# Patient Record
Sex: Male | Born: 1975 | Hispanic: No | Marital: Single | Smoking: Former smoker
Health system: Southern US, Community
[De-identification: ages and names within clinical notes are randomized; demographics above are authoritative.]

## PROBLEM LIST (undated history)

## (undated) DIAGNOSIS — J45909 Unspecified asthma, uncomplicated: Secondary | ICD-10-CM

## (undated) DIAGNOSIS — J302 Other seasonal allergic rhinitis: Secondary | ICD-10-CM

## (undated) DIAGNOSIS — E119 Type 2 diabetes mellitus without complications: Secondary | ICD-10-CM

## (undated) DIAGNOSIS — K603 Anal fistula, unspecified: Secondary | ICD-10-CM

## (undated) DIAGNOSIS — K611 Rectal abscess: Secondary | ICD-10-CM

## (undated) DIAGNOSIS — I10 Essential (primary) hypertension: Secondary | ICD-10-CM

## (undated) HISTORY — PX: INGUINAL HERNIA REPAIR: SUR1180

## (undated) HISTORY — PX: HERNIA REPAIR: SHX51

## (undated) HISTORY — DX: Unspecified asthma, uncomplicated: J45.909

---

## 2001-12-25 ENCOUNTER — Emergency Department (HOSPITAL_COMMUNITY): Admission: EM | Admit: 2001-12-25 | Discharge: 2001-12-26 | Payer: Self-pay | Admitting: Emergency Medicine

## 2011-03-10 ENCOUNTER — Ambulatory Visit: Payer: Self-pay

## 2011-03-10 DIAGNOSIS — J301 Allergic rhinitis due to pollen: Secondary | ICD-10-CM

## 2011-03-10 DIAGNOSIS — J029 Acute pharyngitis, unspecified: Secondary | ICD-10-CM

## 2011-12-24 ENCOUNTER — Ambulatory Visit: Payer: Self-pay | Admitting: Family Medicine

## 2011-12-24 VITALS — BP 132/81 | HR 78 | Temp 98.0°F | Resp 16 | Ht 68.5 in | Wt 199.8 lb

## 2011-12-24 DIAGNOSIS — IMO0002 Reserved for concepts with insufficient information to code with codable children: Secondary | ICD-10-CM

## 2011-12-24 DIAGNOSIS — S39012A Strain of muscle, fascia and tendon of lower back, initial encounter: Secondary | ICD-10-CM

## 2011-12-24 DIAGNOSIS — M549 Dorsalgia, unspecified: Secondary | ICD-10-CM

## 2011-12-24 MED ORDER — MELOXICAM 15 MG PO TABS: 15.0000 mg | ORAL_TABLET | Freq: Every day | ORAL | Status: DC

## 2011-12-24 MED ORDER — CYCLOBENZAPRINE HCL 10 MG PO TABS: 10.0000 mg | ORAL_TABLET | Freq: Every evening | ORAL | Status: DC | PRN

## 2011-12-24 NOTE — Progress Notes (Signed)
[facility] [address], [place] [address] [phone]  Date:  12/24/2011   Name:  Travis Ryan   DOB:  [birth year]   MRN:  [id number]  PCP:  No primary provider on file.    Chief Complaint: Back Pain   History of Present Illness:  Travis Ryan is a 36 y.o. very pleasant male patient who presents with the following:  Last night when he got home from work he had bilateral lower back pain.  Now just the left side of his back hurts.   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  He has not tried any medications for this yet.  No weakness or numbness in his legs.   No bowel or bladder dysfunction.    He has never had this before.  No acute injury- insidious onset of pain.  He had a hard time sleeping last night.    There is no problem list on file for this patient.   No past medical history on file.  No past surgical history on file.  History  Substance Use Topics  . Smoking status: Former Smoker    Start date: 12/24/1999  . Smokeless tobacco: Not on file  . Alcohol Use: Not on file    No family history on file.  No Known Allergies  Medication list has been reviewed and updated.  No current outpatient prescriptions on file prior to visit.    Review of Systems:  As per HPI- otherwise negative. He is generally healthy as far as he knows   Physical Examination: Filed Vitals:   12/24/11 1500  BP: 132/81  Pulse: 78  Temp: 98 F (36.7 C)  Resp: 16   Filed Vitals:   12/24/11 1500  Height: 5' 8.5" (1.74 m)  Weight: 199 lb 12.8 oz (90.629 kg)   Body mass index is 29.94 kg/(m^2). Ideal Body Weight: Weight in (lb) to have BMI = 25: 166.5   GEN: WDWN, NAD, Non-toxic, A & O x 3 HEENT: Atraumatic, Normocephalic. Neck supple. No masses, No LAD. Ears and Nose: No external deformity. CV: RRR, No M/G/R. No JVD. No thrill. No extra heart sounds. PULM: CTA B, no wheezes, crackles, rhonchi. No retractions. No resp.  distress. No accessory muscle use. ABD: S, NT, ND. EXTR: No c/c/e NEURO Normal gait.   Negative SLR bilaterally, normal DTR bilateral legs.  Both legs with normal sensation and strength Back: mild tenderness left thoracic muscles.  No bony TTP.  Good flexion and extension PSYCH: Normally interactive. Conversant. Not depressed or anxious appearing.  Calm demeanor.    Assessment and Plan: 1. Back pain    2. Back strain  cyclobenzaprine (FLEXERIL) 10 MG tablet, meloxicam (MOBIC) 15 MG tablet   Let me know if not better in the next few days- Sooner if worse.     Abbe Amsterdam, MD

## 2012-01-16 ENCOUNTER — Ambulatory Visit: Payer: Self-pay | Admitting: Physician Assistant

## 2012-01-16 VITALS — BP 134/98 | HR 68 | Temp 97.4°F | Resp 16 | Ht 67.0 in | Wt 196.0 lb

## 2012-01-16 DIAGNOSIS — J029 Acute pharyngitis, unspecified: Secondary | ICD-10-CM

## 2012-01-16 LAB — POCT RAPID STREP A (OFFICE): Rapid Strep A Screen: NEGATIVE

## 2012-01-16 MED ORDER — MAGIC MOUTHWASH W/LIDOCAINE: 10.0000 mL | ORAL | Status: DC | PRN

## 2012-01-16 MED ORDER — AMOXICILLIN 875 MG PO TABS: 875.0000 mg | ORAL_TABLET | Freq: Two times a day (BID) | ORAL | Status: DC

## 2012-01-16 NOTE — Progress Notes (Signed)
Subjective:    Patient ID: Travis Ryan, male    DOB: 1976-01-06, 36 y.o.   MRN: [id number]  HPI 36 year old male presents with 2 day history of sore throat, chills, and subjective fever.  Denies cough, nasal congestion, nausea, vomiting, or abdominal pain. Admits that he got his flu shot last week and believes it has made him sick.  No known contacts of strep.  Otherwise healthy person.      Review of Systems  Constitutional: Positive for chills. Negative for fever.  HENT: Positive for congestion and sore throat. Negative for ear pain and neck pain.   Respiratory: Negative for cough, shortness of breath and wheezing.   Gastrointestinal: Negative for nausea, vomiting and abdominal pain.  All other systems reviewed and are negative.       Objective:   Physical Exam  Constitutional: He is oriented to person, place, and time. He appears well-developed and well-nourished.  HENT:  Head: Normocephalic and atraumatic.  Right Ear: Hearing, tympanic membrane, external ear and ear canal normal.  Left Ear: Hearing, tympanic membrane, external ear and ear canal normal.  Mouth/Throat: Uvula is midline, oropharynx is clear and moist and mucous membranes are normal. No oropharyngeal exudate (bilateral erythema, no tonsillar swelling).  Eyes: Conjunctivae normal are normal.  Neck: Normal range of motion. Neck supple.  Cardiovascular: Normal rate, regular rhythm and normal heart sounds.   Pulmonary/Chest: Effort normal and breath sounds normal.  Lymphadenopathy:    He has no cervical adenopathy.  Neurological: He is alert and oriented to person, place, and time.  Psychiatric: He has a normal mood and affect. His behavior is normal. Judgment and thought content normal.          Assessment & Plan:   1. Acute pharyngitis  POCT rapid strep A   Duke's mouthwash prn sore throat Ibuprofen or tylenol as needed Will cover with Amoxicillin 875 mg bid Follow up if symptoms worsen or fail to  improve

## 2013-06-28 ENCOUNTER — Ambulatory Visit: Payer: Self-pay | Admitting: Family Medicine

## 2013-06-28 VITALS — BP 130/94 | HR 72 | Temp 98.2°F | Resp 16 | Ht 69.0 in | Wt 208.0 lb

## 2013-06-28 DIAGNOSIS — R059 Cough, unspecified: Secondary | ICD-10-CM

## 2013-06-28 DIAGNOSIS — IMO0002 Reserved for concepts with insufficient information to code with codable children: Secondary | ICD-10-CM

## 2013-06-28 DIAGNOSIS — J029 Acute pharyngitis, unspecified: Secondary | ICD-10-CM

## 2013-06-28 DIAGNOSIS — S39012A Strain of muscle, fascia and tendon of lower back, initial encounter: Secondary | ICD-10-CM

## 2013-06-28 DIAGNOSIS — R05 Cough: Secondary | ICD-10-CM

## 2013-06-28 MED ORDER — HYDROCODONE-HOMATROPINE 5-1.5 MG/5ML PO SYRP: 5.0000 mL | ORAL_SOLUTION | Freq: Three times a day (TID) | ORAL | Status: DC | PRN

## 2013-06-28 MED ORDER — CYCLOBENZAPRINE HCL 10 MG PO TABS: 10.0000 mg | ORAL_TABLET | Freq: Every evening | ORAL | Status: DC | PRN

## 2013-06-28 MED ORDER — AMOXICILLIN 875 MG PO TABS: 875.0000 mg | ORAL_TABLET | Freq: Two times a day (BID) | ORAL | Status: DC

## 2013-06-28 NOTE — Progress Notes (Signed)
      Acute pharyngitis - Plan: amoxicillin (AMOXIL) 875 MG tablet  Cough - Plan: HYDROcodone-homatropine (HYCODAN) 5-1.5 MG/5ML syrup  Back strain - Plan: cyclobenzaprine (FLEXERIL) 10 MG tablet  Signed, Elvina SidleKurt Karanvir Balderston, MD

## 2013-09-12 ENCOUNTER — Ambulatory Visit (INDEPENDENT_AMBULATORY_CARE_PROVIDER_SITE_OTHER): Payer: Self-pay | Admitting: Family Medicine

## 2013-09-12 ENCOUNTER — Ambulatory Visit (INDEPENDENT_AMBULATORY_CARE_PROVIDER_SITE_OTHER): Payer: Self-pay

## 2013-09-12 VITALS — BP 122/88 | HR 82 | Temp 98.2°F | Resp 18 | Ht 68.75 in | Wt 206.6 lb

## 2013-09-12 DIAGNOSIS — D72829 Elevated white blood cell count, unspecified: Secondary | ICD-10-CM

## 2013-09-12 DIAGNOSIS — M25559 Pain in unspecified hip: Secondary | ICD-10-CM

## 2013-09-12 DIAGNOSIS — R109 Unspecified abdominal pain: Secondary | ICD-10-CM

## 2013-09-12 DIAGNOSIS — T148XXA Other injury of unspecified body region, initial encounter: Secondary | ICD-10-CM

## 2013-09-12 LAB — POCT CBC
Granulocyte percent: 71.6 %G (ref 37–80)
HCT, POC: 47.2 % (ref 43.5–53.7)
Hemoglobin: 15.5 g/dL (ref 14.1–18.1)
Lymph, poc: 2.6 (ref 0.6–3.4)
MCH, POC: 29.5 pg (ref 27–31.2)
MCHC: 32.8 g/dL (ref 31.8–35.4)
MCV: 90 fL (ref 80–97)
MID (cbc): 0.7 (ref 0–0.9)
MPV: 10.2 fL (ref 0–99.8)
POC Granulocyte: 8.2 — AB (ref 2–6.9)
POC LYMPH PERCENT: 22.5 %L (ref 10–50)
POC MID %: 5.9 %M (ref 0–12)
Platelet Count, POC: 231 10*3/uL (ref 142–424)
RBC: 5.25 M/uL (ref 4.69–6.13)
RDW, POC: 13.4 %
WBC: 11.4 10*3/uL — AB (ref 4.6–10.2)

## 2013-09-12 LAB — POCT UA - MICROSCOPIC ONLY
Bacteria, U Microscopic: NEGATIVE
Casts, Ur, LPF, POC: NEGATIVE
Crystals, Ur, HPF, POC: NEGATIVE
Mucus, UA: NEGATIVE
RBC, urine, microscopic: NEGATIVE
WBC, Ur, HPF, POC: NEGATIVE
Yeast, UA: NEGATIVE

## 2013-09-12 LAB — POCT URINALYSIS DIPSTICK
Bilirubin, UA: NEGATIVE
Blood, UA: NEGATIVE
Glucose, UA: NEGATIVE
Ketones, UA: NEGATIVE
Leukocytes, UA: NEGATIVE
Nitrite, UA: NEGATIVE
Spec Grav, UA: 1.025
Urobilinogen, UA: 0.2
pH, UA: 5.5

## 2013-09-12 MED ORDER — AZITHROMYCIN 250 MG PO TABS: ORAL_TABLET | ORAL | Status: DC

## 2013-09-12 MED ORDER — MELOXICAM 15 MG PO TABS: 15.0000 mg | ORAL_TABLET | Freq: Every day | ORAL | Status: DC

## 2013-09-12 MED ORDER — CYCLOBENZAPRINE HCL 10 MG PO TABS: 10.0000 mg | ORAL_TABLET | Freq: Every evening | ORAL | Status: DC | PRN

## 2013-09-12 NOTE — Patient Instructions (Signed)
RESOURCE GUIDE ° °Chronic Pain Problems: °Contact [facility]  [phone] °Patients need to be referred by their primary care doctor. ° °Insufficient Money for Medicine: °Contact United Way:  call [phone]) [phone] ° °No Primary Care Doctor: °- Call [facility]  [phone] - can help you locate a primary care doctor that  accepts your insurance, provides certain services, etc. °- Physician Referral Service- 1-800-533-3463 ° °Agencies that provide inexpensive medical care: °- [facility]  [phone] °- [facility]  [phone] °- [facility]  [phone] °- [facility]  [phone] °- [facility]  [phone] °- [facility]  [phone] ° °Medicaid-accepting [place] Providers: °- [facility]- [address] ° [phone], Mon-Fri 9am-7pm, Sat 9am-1pm °- [facility]- [address], [address] ° [phone] °- [facility]- [address], [address] ° [phone] °- [facility]- [address] [place] [address] ° [phone] °- [facility]- [address], [phone] ° Only accepts [place] Medicaid patients after they have their name  applied to their card ° °Self Pay (no insurance) in [place]: °- Sickle Cell Patients - [facility] ° [address], [phone] °- [facility]- [address] ° [phone] °      -      [facility]- [address] °      -     [facility]- see information above (Speak to Pam H if you do not have insurance) °      -  [facility] [place]- [address],  [phone] °      -  [facility]- [address] [place] [address], [phone] °      -  Dr Osei-Bonsu-  [address], [place], [phone] °      -  [facility] - [address], [phone] °      -  [facility]- [address] [place] [address], [phone], also  [address], [phone] °      -     [facility]- [address], [phone], 1st & 3rd Saturday °        every month, 10am-1pm ° -     [facility] °  [address], [place]. °  Phone:  [phone], Fax:  [phone]. Hours of Operation:  9 am - 6 pm, M-F. ° -     [facility] °  [address], [address], [place] °  Phone: [phone], Fax: [phone]. Hours of Operation:  8:30 am - 5:30 pm, M-F. ° °[facility] °[address] °, [place] [address] °(336) [phone] ° °[facility] °[address] °[place],  [address] °(336) [phone] ° °1) Find a Doctor and Pay Out of Pocket °Although you won't have to find out who is covered by your insurance plan, it is a good idea to ask around and get recommendations. You will then need to call the office and see if the doctor you have chosen will accept you as a new patient and what types of options they offer for patients who are self-pay. Some doctors offer discounts or will set up payment plans for their patients who do not have insurance, but   you will need to ask so you aren't surprised when you get to your appointment. ° °2) Contact Your Local Health Department °Not all health departments have doctors that can see patients for sick visits, but many do, so it is worth a call to see if yours does. If you don't know where your local health department is, you can check in your phone book. The CDC also has a tool to help you locate your state's health department, and many state websites also have listings of all of their local health departments. ° °3) Find a Walk-in Clinic °If your illness is not likely to be very severe or complicated, you may want to try a walk in clinic. These are popping up all over the country in pharmacies, drugstores, and shopping centers. They're usually staffed by nurse practitioners or physician assistants that have been trained  to treat common illnesses and complaints. They're usually fairly quick and inexpensive. However, if you have serious medical issues or chronic medical problems, these are probably not your best option ° °STD Testing °- [place] [facility] [place], [facility], [address], [place], phone [phone] or [phone].  Monday - Friday, call for an appointment. °- [place] [facility] [place], [facility], [address], [place], phone [phone] or [phone].  Monday - Friday, call for an appointment. ° °Abuse/Neglect: °- [place] Child Abuse Hotline (336) 641-3795 °- [place] Child Abuse Hotline 800-378-5315 (After Hours) ° °Emergency Shelter:  [facility] (336) 271-5985 ° °Maternity Homes: °- [facility] (336) 275-9566 °- [facility] (704) 372-4663 ° °MRSA Hotline #:   832-7006 ° °Dental Assistance °If unable to pay or uninsured, contact:  [place] [facility] to become qualified for the adult dental clinic. ° °Patients with Medicaid: [facility] °[address], [phone] °[address], [phone] ° °If unable to pay, or uninsured, contact [place] [facility] ([phone] in [place], [phone] in [place]) to become qualified for the adult dental clinic ° °[facility] °[address] °[place], [place] [address] °(336) 272-4177 °www.[facility].com ° °Other Low-Cost Community Dental Services: °- [facility]- [address], [place], [place], [address], [phone], Ext. 123, 2nd and 4th Thursday of the month at 6:30am.  10 clients each day by appointment, can sometimes see walk-in patients if someone does not show for an appointment. °- [facility]- [address], [place], [place], [address], [phone] °- [facility]- [address], [place], [place], [address], [phone] °- [place]  [facility]- 342-8273 °- [facility]- [phone] °- [facility]- 570-6415 ° °     Behavioral Health Resources in the Community ° °Intensive Outpatient Programs: °[facility]      °[address] °[place], [place] °336-878-6098 °Both a day and evening program °      °[facility]     °[address]        °[place], [place] [address] °336-832-9800        ° °[facility] °[address] °[place] °336-882-2125 ° °[facility] °ACCESS LINE: 1-800-853-5163 or 336-641-4981 °[address] °[place], [place] [address] °Http://www.[email or web address].[email or web address]/adult.htm ° ° °Substance Abuse Resources: °- [facility]  336-882-2125 °- [facility] 336-784-9470 °- [facility] 336-285-9073 °- [facility] 336-845-3988 °-   Residential & Outpatient Substance Abuse Program  800-659-3381 ° °Psychological Services: °- [facility]  832-9600 °- [facility]  378-7881 °- [facility], [address], [place], ACCESS LINE: 1-800-853-5163 or 336-641-4981, Http://www.[facility].com/services/adult.htm ° °Mobile Crisis Teams:         °                               °[facility]         °Mobile Crisis Care Unit °1-877-626-1772       °      °Assertive °[facility] °[address] [place] °336-834-9664 °                                        °[facility] °Sharon DeEsch °[address] °[place] °336-554-5454 ° °Self-Help/Support Groups: °[facility] Variety of support groups °373-1402 (call for more info) ° °Narcotics Anonymous (NA) °[facility] °[address] °[place] - 2 meetings at this location ° °Residential Treatment Programs:  °[facility]      °[address]        °[place]       °866-801-8205        ° °[facility] °[address] °[place], [place]  [address] °704-293-8524 ° °[facility]  °[address] °[place], [place] [address] °336-845-3988 °Admissions: 8am-3pm M-F ° °[facility]     °[address]        °[place], [place] [address]       °336-841-1104        ° °[facility] °[address] °[place], [place] °336-379-7146 ° °[facility] °[address] °[place], [place] °336-285-9073 ° °[facility] - Intensive Outpatient      °[address]     °[place], [place]       °852-3033        ° °[facility]     °[address] °[place], [place] °877-615-2722 or 336-784-9470 ° °[facility] ([facility]), Medicaid °[address] °[place], [place] °336-227-7417 ° °[facility]                                               °[address] °[place] °800-659-3381 ° °[place] [facility]: °[facility]- 1-888-581-9988              ° °General Therapy                                                °Julie Brannon, PhD        °[address]                                       °[place],  [address]         °336-349-5553   °Insurance ° °[facility]   °[address] °[place], [place] [address] °336-349-4454 ° °[facility] °[address] [place], [place] [address] °336-342-8316 °Insurance/Medicaid/sponsorship through [facility] ° °[facility]                                              °[address]                                        °[place], [place] [address]    °Therapy/tele-psych/case         °336-342-8316        °  °[facility] °[address]  ° [place], [place]  [address]  °Adolescent/group home/case management °336-349-2233  °                                         °Julia Brannon PhD       °General therapy       °Insurance   °336-951-0000        ° °Dr. Arfeen, Insurance, M-F °336- 349-4544 ° °[facility] [place]  [facility]  [employer] [place] [facility] °[address]                 [address]         [address]  °[place]                                               [place]                              [place] °Phone:  [phone]                                  Phone:  [phone]                   Phone:  342-8140 ° °[facility], 342-8316 °- [place] [facility] - [facility]- 1-888-581-9988 °      -     [facility] in [place], [address], °            336-349-4454, Insurance ° °[place] Child Abuse Hotline °(336) 342-1394 or (336) 342-3537 (After Hours) ° ° °

## 2013-09-12 NOTE — Progress Notes (Signed)
Chief Complaint:  Chief Complaint  Patient presents with  . Abdominal Pain    x 2 days, lower left and right abdominal pain, lower back pain, movement makes it worse    HPI: Travis Ryan is a 38 y.o. male who is here for  2 day history of bilateral lower pelvic pain 2 days ago, this occurred after  [place] and started having significant pain in bilateral legs near his groin and pelvic area, He has sharp pain, better today but still has a lot of pain when he moves, He has not tried anythign for this. HE Denies any fevers or chills, any nv, numbness or tingling, problems with incontinence, urination issues, or tick bites..He states he has had low back pain in the  Past where he ahd to be in rehab for 6 months due to a lifting injury but he ahs not had back pain since his rehab was done. Again he denies any urianry sxs or bloody urine, he denies STDs.  He denies any h.o kidney stones, UTIs, constipation; has been passing gas and also had normal BM    History reviewed. No pertinent past medical history. History reviewed. No pertinent past surgical history. History   Social History  . Marital Status: Single    Spouse Name: N/A    Number of Children: N/A  . Years of Education: N/A   Social History Main Topics  . Smoking status: Former Smoker    Start date: 12/24/1999  . Smokeless tobacco: None  . Alcohol Use: 7.2 oz/week    12 Cans of beer per week     Comment: socal  . Drug Use: No  . Sexual Activity: None   Other Topics Concern  . None   Social History Narrative  . None   Family History  Problem Relation Age of Onset  . Cancer Maternal Grandmother   . Early death Maternal Grandfather   . Cancer Paternal Grandmother   . Early death Paternal Grandfather    No Known Allergies Prior to Admission medications   Medication Sig Start Date End Date Taking? Authorizing Provider  Alum & Mag Hydroxide-Simeth (MAGIC  MOUTHWASH W/LIDOCAINE) SOLN Take 10 mLs by mouth every 2 (two) hours as needed. Use 1:1 ratio with viscous lidocaine and [facility]'s. 01/16/12   Heather Jaquita RectorM Marte, PA-C  amoxicillin (AMOXIL) 875 MG tablet Take 1 tablet (875 mg total) by mouth 2 (two) times daily. 06/28/13   Elvina SidleKurt Lauenstein, MD  cyclobenzaprine (FLEXERIL) 10 MG tablet Take 1 tablet (10 mg total) by mouth at bedtime as needed for muscle spasms. 06/28/13   Elvina SidleKurt Lauenstein, MD  HYDROcodone-homatropine [facility](HYCODAN) 5-1.5 MG/5ML syrup Take 5 mLs by mouth every 8 (eight) hours as needed for cough. 06/28/13   Elvina SidleKurt Lauenstein, MD  meloxicam (MOBIC) 15 MG tablet Take 1 tablet (15 mg total) by mouth daily. 12/24/11   Gwenlyn FoundJessica C Copland, MD     ROS: The patient denies fevers, chills, night sweats, unintentional weight loss, chest pain, palpitations, wheezing, dyspnea on exertion, nausea, vomiting, abdominal pain, dysuria, hematuria, melena, numbness, weakness, or tingling or incontinence  All other systems have been reviewed and were otherwise negative with the exception of those mentioned in the HPI and as above.    PHYSICAL EXAM: Filed Vitals:   09/12/13 1459  BP: 122/88  Pulse: 82  Temp: 98.2 F (36.8 C)  Resp: 18   Filed Vitals:   09/12/13 1459  Height: 5' 8.75" (1.746 m)  Weight: 206 lb 9.6 oz (93.713 kg)   Body mass index is 30.74 kg/(m^2).  General: Alert, no acute distress HEENT:  Normocephalic, atraumatic, oropharynx patent. EOMI, PERRLA Cardiovascular:  Regular rate and rhythm, no rubs murmurs or gallops.  No Carotid bruits, radial pulse intact. No pedal edema.  Respiratory: Clear to auscultation bilaterally.  No wheezes, rales, or rhonchi.  No cyanosis, no use of accessory musculature GI: No organomegaly, abdomen is soft and non-tender, positive bowel sounds.  No masses. Skin: No rashes. Neurologic: Facial musculature symmetric. Psychiatric: Patient is appropriate throughout our interaction. Lymphatic: No cervical  lymphadenopathy Musculoskeletal: Gait intact. NEg for abd or inguinal hernia + paramsk tenderness along the pubic symphysis and also the lower pelvic area He has Full ROM of his hips and legs but he is in pain at the pelvic area when he moves his legs only 5/5 strength, 2/2 DTRs No saddle anesthesia Straight leg negative Hip and knee exam--normal Back exam normal-full ROM without pain    LABS: Results for orders placed in visit on 09/12/13  POCT CBC      Result Value Ref Range   WBC 11.4 (*) 4.6 - 10.2 K/uL   Lymph, poc 2.6  0.6 - 3.4   POC LYMPH PERCENT 22.5  10 - 50 %L   MID (cbc) 0.7  0 - 0.9   POC MID % 5.9  0 - 12 %M   POC Granulocyte 8.2 (*) 2 - 6.9   Granulocyte percent 71.6  37 - 80 %G   RBC 5.25  4.69 - 6.13 M/uL   Hemoglobin 15.5  14.1 - 18.1 g/dL   HCT, POC 45.4  09.8 - 53.7 %   MCV 90.0  80 - 97 fL   MCH, POC 29.5  27 - 31.2 pg   MCHC 32.8  31.8 - 35.4 g/dL   RDW, POC 11.9     Platelet Count, POC 231  142 - 424 K/uL   MPV 10.2  0 - 99.8 fL  POCT UA - MICROSCOPIC ONLY      Result Value Ref Range   WBC, Ur, HPF, POC neg     RBC, urine, microscopic neg     Bacteria, U Microscopic neg     Mucus, UA neg     Epithelial cells, urine per micros 0-1     Crystals, Ur, HPF, POC neg     Casts, Ur, LPF, POC neg     Yeast, UA neg    POCT URINALYSIS DIPSTICK      Result Value Ref Range   Color, UA dark yellow     Clarity, UA clear     Glucose, UA neg     Bilirubin, UA neg     Ketones, UA neg     Spec Grav, UA 1.025     Blood, UA neg     pH, UA 5.5     Protein, UA trace     Urobilinogen, UA 0.2     Nitrite, UA neg     Leukocytes, UA Negative       EKG/XRAY:   Primary read interpreted by [facility] at [facility]. Neg for fracture or dislocation Normal abd xray   ASSESSMENT/PLAN: Encounter Diagnoses  Name Primary?  . Abdominal pain, unspecified site Yes  . Pain in joint, pelvic region and thigh, unspecified laterality    Very pleasant uninsured hispanic male  who is here for pelvic pain, KUB and pelvic xray normal,  urine is normal He has some mils leukocytosis which I will presumptively treat with Azithroymycin 1 gram x 1 for Chlamydia coverage He declines TSD testing due to expense, he was given resources to go get tested for other STDs The slight white count may be  due to msk strain and sprain. Rx mobic and flexeril IF no resolution then follow-up.   Gross sideeffects, risk and benefits, and alternatives of medications d/w patient. Patient is aware that all medications have potential sideeffects and we are unable to predict every sideeffect or drug-drug interaction that may occur.  Hamilton CapriLE, Falecia Vannatter PHUONG, DO 09/12/2013 4:45 PM

## 2014-02-09 ENCOUNTER — Ambulatory Visit (INDEPENDENT_AMBULATORY_CARE_PROVIDER_SITE_OTHER): Payer: Self-pay | Admitting: Sports Medicine

## 2014-02-09 VITALS — BP 126/84 | HR 83 | Temp 97.9°F | Resp 18 | Ht 69.0 in | Wt 207.0 lb

## 2014-02-09 DIAGNOSIS — M7042 Prepatellar bursitis, left knee: Secondary | ICD-10-CM | POA: Insufficient documentation

## 2014-02-09 MED ORDER — MELOXICAM 15 MG PO TABS: 15.0000 mg | ORAL_TABLET | Freq: Every day | ORAL | Status: DC

## 2014-02-09 NOTE — Progress Notes (Signed)
Travis Ryan - 38 y.o. male MRN [id number]  Date of birth: April 05, 1975  CC & HPI:  Patient presents with acute complaint of: Left anterior knee swelling: Patient reports proximally 6 hours of left anterior knee swelling. He denies any pain with this. No fevers, chills, redness or erythema. 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 Has never had anything like this before. Reports some difficulty with extension but uncomfortable due to tightness more so than painful.  ROS:  Per HPI.   HISTORY: Past Medical, Surgical, Social, and Family History Reviewed & Updated per EMR.  Pertinent Historical Findings include: Otherwise healthy   OBJECTIVE:  VS:   HT:5\' 9"  (175.3 cm)   WT:207 lb (93.895 kg)  BMI:30.6          BP:126/84 mmHg  HR:83bpm  TEMP:97.9 F (36.6 C)(Oral)  RESP:95 %  PHYSICAL EXAM: GENERAL:  adult Hispanic male. In no discomfort; no respiratory distress Patient declines interpreter services and speaks good [place]   PSYCH: alert and appropriate, good insight   NEURO: sensation is intact to light touch in BLE   VASCULAR:  DP pulses 2+/4.  No significant edema.    Left knee EXAM: Appearance:  marked anterior swelling over the patella. Fluctuant and palpable bursa No significant intra-articular effusion.   Skin: No overlying erythema/ecchymosis. no streaking erythema   Palpation: No TTP over: Prepatellar bursa, medial or lateral joint lines, patellar or quadriceps tendon   Strength, ROM & OtherTests:  flexor and extensor mechanisms intact Stable anterior posterior drawer, stable to varus and valgus strain. Negative McMurray's    ASSESSMENT: 1. Prepatellar bursitis of left knee    Nonseptic prepatellar traumatic bursitis  PLAN: See problem based charting & AVS for additional documentation.  Meloxicam 2 weeks  Compression  Ice  Reviewed red flags for worsening symptoms that would warrant aspiration/ I&D. This was offered  today but patient declined > Return if symptoms worsen or fail to improve.

## 2014-02-09 NOTE — Patient Instructions (Signed)
Keep your knee wrapped with the ace wrap Use ice for 15 minutes 3-5Xs daily Prepatellar Bursitis with Rehab  Bursitis is a condition that is characterized by inflammation of a bursa. Kateri McBursa exists in many areas of the body. They are fluid-filled sacs that lie between a soft tissue (skin, tendon, or ligament) and a bone, and they reduce friction between the structures as well as the stress placed on the soft tissue. Prepatellar bursitis is inflammation of the bursa that lies between the skin and the kneecap (patella). This condition often causes pain over the patella. SYMPTOMS   Pain, tenderness, and/or inflammation over the patella.  Pain that worsens with movement of the knee joint.  Decreased range of motion for the knee joint.  A crackling sound (crepitation) when the bursa is moved or touched.  Occasionally, painless swelling of the bursa.  Fever (when infected). CAUSES  Bursitis is caused by damage to the bursa, which results in an inflammatory response. Common mechanisms of injury include:  Direct trauma to the front of the knee.  Repetitive and/or stressful use of the knee. RISK INCREASES WITH:  Activities in which kneeling and/or falling on one's knees is likely (volleyball or football).  Repetitive and stressful training, especially if it involves running on hills.  Improper training techniques, such as a sudden increase in the intensity, frequency, or duration of training.  Failure to warm up properly before activity.  Poor technique.  Artificial turf. PREVENTION   Avoid kneeling or falling on your knees.  Warm up and stretch properly before activity.  Allow for adequate recovery between workouts.  Maintain physical fitness:  Strength, flexibility, and endurance.  Cardiovascular fitness.  Learn and use proper technique. When possible, have a coach correct improper technique.  Wear properly fitted and padded protective equipment (kneepads). PROGNOSIS  If  treated properly, then the symptoms of prepatellar bursitis usually resolve within 2 weeks. RELATED COMPLICATIONS   Recurrent symptoms that result in a chronic problem.  Prolonged healing time, if improperly treated or reinjured.  Limited range of motion.  Infection of bursa.  Chronic inflammation or scarring of bursa. TREATMENT  Treatment initially involves the use of ice and medication to help reduce pain and inflammation. The use of strengthening and stretching exercises may help reduce pain with activity, especially those of the quadriceps and hamstring muscles. These exercises may be performed at home or with referral to a therapist. Your caregiver may recommend kneepads when you return to playing sports, in order to reduce the stress on the prepatellar bursa. If symptoms persist despite treatment, then your caregiver may drain fluid out with a needle (aspirate) the bursa. If symptoms persist for greater than 6 months despite nonsurgical (conservative) treatment, then surgery may be recommended to remove the bursa.  MEDICATION  If pain medication is necessary, then nonsteroidal anti-inflammatory medications, such as aspirin and ibuprofen, or other minor pain relievers, such as acetaminophen, are often recommended.  Do not take pain medication for 7 days before surgery.  Prescription pain relievers may be given if deemed necessary by your caregiver. Use only as directed and only as much as you need.  Corticosteroid injections may be given by your caregiver. These injections should be reserved for the most serious cases, because they may only be given a certain number of times. HEAT AND COLD  Cold treatment (icing) relieves pain and reduces inflammation. Cold treatment should be applied for 10 to 15 minutes every 2 to 3 hours for inflammation and pain and immediately  after any activity that aggravates your symptoms. Use ice packs or massage the area with a piece of ice (ice  massage).  Heat treatment may be used prior to performing the stretching and strengthening activities prescribed by your caregiver, physical therapist, or athletic trainer. Use a heat pack or soak the injury in warm water. SEEK MEDICAL CARE IF:  Treatment seems to offer no benefit, or the condition worsens.  Any medications produce adverse side effects.   Bursitis prerrotuliana con rehabilitacin (Prepatellar Bursitis with Rehab) La bursitis es una enfermedad caracterizada por la inflamacin de una bursa. Las bursas existen en diferentes partes del cuerpo. Son bolsas llenas de lquido que se McDonald's Corporationubican entre los tejidos blandos (piel, tendones o Proofreaderligamentos) y Engineer, structuralun hueso, y Interior and spatial designerdisminuyen la friccin entre las estructuras as Neurosurgeoncomo el estrs que se aplica sobre los tejidos blandos. La bursitis prerrotuliana es una inflamacin de la bursa que se encuentra entre la piel y la Renovortula. Esta afeccin causa dolor en la rtula. SNTOMAS  Dolor, hinchazn, o molestias debajo de la rtula.  Dolor que empeora al mover la articulacin de la rodilla.  Disminucin de la amplitud de movimientos de la articulacin de la rodilla.  Ruido de "crack" (crepitacin) al mover o tocar la bursa.  Otras veces, la bursa se hincha pero no hay dolor.  Puede haber fiebre (en casos de infeccin). CAUSAS La causa de la bursitis es una lesin en la bursa, que da como resultado una respuesta inflamatoria. Los mecanismos ms frecuentes de la lesin son:  Traumatismo directo la zona anterior de la rodilla.  Actividad repetida o estresante de la rodilla. LOS RIESGOS AUMENTAN CON:  Actividades en las que son habituales las cadas o flexiones de las rodilla (voley o ftbol).  Entrenamiento repetitivo y Higher education careers adviserestresante, especialmente si involucra el correr sobre superficies inclinadas.  Tcnicas de entrenamiento incorrectas, incluso cambios repentinos en la intensidad, frecuencia o duracin de la [place]ividad.  No hacer un  precalentamiento adecuado.  Mala tcnica.  Csped sinttico. PREVENCIN  Evite arrodillarse o caer sobre las rodillas.  Precalentamiento adecuado y elongacin antes de la [place]ividad.  Descanso y recuperacin entre actividades.  Mantener la forma fsica:  Earma ReadingFuerza, flexibilidad y resistencia muscular.  Capacidad cardiovascular.  Aprenda y USAAutilice las tcnicas adecuadas. Cuando sea posible, cuente con un entrenador que corrija las tcnicas incorrectas.  Use el equipo protector adecuado y que le ajuste bien (protectores para las rodillas). PRONSTICO Si se trata adecuadamente, generalmente es curable dentro de las 2 semanas. COMPLICACIONES RELACIONADAS  La recurrencia frecuente de los sntomas puede dar como resultado un problema crnico.  Si no se trata adecuadamente, la curacin demorar ms tiempo.  Amplitud de movimientos limitada.  Infeccin de la bursa.  Inflamacin crnica o cicatrizacin de la bursa. TRATAMIENTO El tratamiento inicial incluye el uso de medicamentos y la aplicacin de hielo para reducir Chief Technology Officerel dolor y la inflamacin. Los ejercicios de elongacin y fortalecimiento pueden ayudar a reducir Chief Technology Officerel dolor con la actividad, en especial los cudriceps y los isquiotibiales. Los ejercicios pueden Management consultantrealizarse en el hogar o con un terapeuta. El mdico podr indicarle el uso de rodilleras cuando retorne a la prctica de deportes, con el objeto de reducir el estrs en la bursa prerrotuliana. Si los sntomas persisten a pesar del tratamiento, el mdico podr drenar (aspirar) el lquido de la bursa con una aguja. Si los sntomas persisten por ms de 6 meses de tratamiento no quirrgico (conservador), se indicar la ciruga para extirpar la bursa.  MEDICAMENTOS   Si  necesita analgsicos, se recomiendan los antiinflamatorios no esteroides, como aspirina e ibuprofeno y otros calmantes menores, como acetaminofeno  No tome medicamentos para Chief Technology Officer dentro de los [address] previos a la  [place].  Los analgsicos prescriptos se indicarn si el mdico lo considera necesario. Utilcelos como se le indique y slo cuando lo necesite.  En algunos casos se indica una inyeccin de corticosteroides. Estas inyecciones deben reservarse para los [place] graves, porque slo se pueden administrar una determinada cantidad de veces. CALOR Y FRO   El tratamiento con fro [employer] y reduce la inflamacin. El fro debe aplicarse durante 10 a 15 minutos cada 2  3 horas para reducir la inflamacin y Chief Technology Officer e inmediatamente despus de cualquier actividad que agrava los sntomas. Utilice bolsas de hielo o masajee la zona con un trozo de hielo (masaje de hielo).  El calor puede usarse antes de Therapist, music y de las actividades de fortalecimiento indicadas por el profesional, le fisioterapeuta o Orthoptist. Utilice una bolsa trmica o sumerja la lesin en agua caliente. SOLICITE ATENCIN MDICA SI:  El tratamiento no Radiographer, therapeutic buenos resultados o el problema [place].  Algn medicamento le produce [employer].  ExitCare Patient Information 2015 [place], [place]. This information is not intended to replace advice given to you by your health care provider. Make sure you discuss any questions you have with your health care provider.

## 2014-03-02 ENCOUNTER — Ambulatory Visit (INDEPENDENT_AMBULATORY_CARE_PROVIDER_SITE_OTHER): Payer: Self-pay | Admitting: Family Medicine

## 2014-03-02 ENCOUNTER — Ambulatory Visit (HOSPITAL_COMMUNITY)
Admission: RE | Admit: 2014-03-02 | Discharge: 2014-03-02 | Disposition: A | Discharge status: Home / Self Care | Payer: Self-pay | Source: Ambulatory Visit | Attending: Pediatrics | Admitting: Pediatrics

## 2014-03-02 VITALS — BP 120/88 | HR 73 | Temp 98.6°F | Resp 16 | Ht 69.5 in | Wt 213.2 lb

## 2014-03-02 DIAGNOSIS — R202 Paresthesia of skin: Secondary | ICD-10-CM | POA: Insufficient documentation

## 2014-03-02 DIAGNOSIS — R609 Edema, unspecified: Secondary | ICD-10-CM

## 2014-03-02 DIAGNOSIS — M7989 Other specified soft tissue disorders: Secondary | ICD-10-CM | POA: Insufficient documentation

## 2014-03-02 DIAGNOSIS — M7042 Prepatellar bursitis, left knee: Secondary | ICD-10-CM

## 2014-03-02 DIAGNOSIS — M7122 Synovial cyst of popliteal space [Baker], left knee: Secondary | ICD-10-CM

## 2014-03-02 LAB — POCT CBC
GRANULOCYTE PERCENT: 59.9 % (ref 37–80)
HEMATOCRIT: 47.1 % (ref 43.5–53.7)
Hemoglobin: 16.1 g/dL (ref 14.1–18.1)
Lymph, poc: 2.8 (ref 0.6–3.4)
MCH: 29.7 pg (ref 27–31.2)
MCHC: 34.2 g/dL (ref 31.8–35.4)
MCV: 87.1 fL (ref 80–97)
MID (CBC): 0.6 (ref 0–0.9)
MPV: 8.6 fL (ref 0–99.8)
PLATELET COUNT, POC: 168 10*3/uL (ref 142–424)
POC Granulocyte: 5.41 (ref 2–6.9)
POC LYMPH %: 32.8 % (ref 10–50)
POC MID %: 7.3 %M (ref 0–12)
RBC: 5.41 M/uL (ref 4.69–6.13)
RDW, POC: 12.3 %
WBC: 8.6 10*3/uL (ref 4.6–10.2)

## 2014-03-02 LAB — GLUCOSE, POCT (MANUAL RESULT ENTRY): POC Glucose: 98 mg/dl (ref 70–99)

## 2014-03-02 LAB — POCT SEDIMENTATION RATE: POCT SED RATE: 2 mm/h (ref 0–22)

## 2014-03-02 NOTE — Progress Notes (Signed)
*  PRELIMINARY RESULTS* Vascular Ultrasound Left lower extremity venous duplex has been completed.  Preliminary findings: No evidence of DVT or baker's cyst.  Called results to Dr. Alver FisherShaw's office. Gave results to Maralyn SagoSarah.   Farrel DemarkJill Eunice, RDMS, RVT  03/02/2014, 3:58 PM

## 2014-03-02 NOTE — Patient Instructions (Addendum)
Please call the [facility] Financial Aide office at [phone] to ensure you get the 50% discount on all services for self-pay patients.  Please call the clinic below to get an appointment.  They are a clinic set up by [facility] to care for people without health insurance so may be able to get you care at much cheaper cost with a large discount on labs, imaging, and medication. [facility] [address] [place], [place] [address] Hours of Operation Mon - Fri: 9 a.m. - 6 p.m. Main: [phone]  Start an omega-3 supplement and glucosamine-chondroiton supplement.  Increase water to at least 8 glasses per day and eat plenty of protein.  Decrease salt - make sure to read labels.  Stop meloxicam for now.  Elevate your leg as much as possible.  Ice 15 minutes 3-4 times a day.  Quiste de Social research officer, government Cyst) El quiste de Engineer, production es una estructura similar a una bolsa, que se encuentra en la zona posterior de la rodilla. Est llena del mismo lquido contenido por la rodilla. El lquido [employer] y el cartlago de la rodilla y les permite desplazarse, uno sobre otro, ms fcilmente. CAUSAS  Cuando la rodilla se lesiona o inflama, se forma ms lquido en su interior. Cuando esto ocurre, el revestimiento de la articulacin es empujado hacia la parte posterior de la rodilla y forma el quiste de Engineer, production. Este quiste tambin puede ser provocado por inflamaciones derivadas de enfermedades artrticas e infecciones. SIGNOS Y SNTOMAS  El quiste de Engineer, production generalmente no presenta sntomas. Cuando el quiste se agranda considerablemente:  Puede sentir rigidez en la rodilla, presin o un bulto en la zona posterior.  Puede presentar dolor, enrojecimiento e hinchazn en la pantorrilla. Esto puede sugerir la formacin de un cogulo sanguneo y requiere Stage manager de parte de su mdico. DIAGNSTICO  Un quiste de Engineer, production se detecta, con mayor frecuencia, durante  una ecografa. Es posible que este examen se haya indicado por otras razones y que el quiste se detecte accidentalmente. En ocasiones, se utiliza Hotel manager. Este estudio detecta otros problemas en una articulacin, que no pueden observarse en una ecografa. Si el quiste de Engineer, production se form inmediatamente despus de una lesin, se pueden Chemical engineer radiografas para su diagnstico. TRATAMIENTO  El tratamiento depende de la causa del [place]. A menudo se indicarn antiinflamatorios y reposo. Si el quiste es provocado por una infeccin bacteriana, pueden recetarse antibiticos.  INSTRUCCIONES PARA EL CUIDADO EN EL HOGAR   Si el quiste fue causado por una lesin, mantenga elevada la pierna lesionada sobre 2 almohadas mientras est [place], durante las primeras 24 horas.  Aplique hielo a la zona lesionada mientras est despierto, durante las primeras 24 horas.  Ponga el hielo en una bolsa plstica.  Colquese una toalla entre la piel y la bolsa de hielo.  Deje el hielo durante 20 minutos, 2 a 3 veces por da.  Slo tome medicamentos de venta libre o recetados para Primary school teacher, Environmental health practitioner o bajar la fiebre, segn las indicaciones de su mdico.  [employer] antibiticos tal como se le indic. Finalice la prescripcin completa, aunque se sienta mejor. ASEGRESE DE QUE:   Comprende estas instrucciones.  Controlar su afeccin.  Recibir ayuda de inmediato si no mejora o si empeora. Document Released: 03/10/2005 Document Revised: 12/29/2012 [facility] Patient Information 2015 [place], [place]. This information is not intended to replace advice given to you by your health care provider.  Make sure you discuss any questions you have with your health care provider.   Bursitis prerrotuliana con rehabilitacin (Prepatellar Bursitis with Rehab) La bursitis es una enfermedad caracterizada por la inflamacin de una bursa. Las bursas existen en diferentes partes del cuerpo. Son bolsas llenas de  lquido que se McDonald's Corporationubican entre los tejidos blandos (piel, tendones o Proofreaderligamentos) y Engineer, structuralun hueso, y Interior and spatial designerdisminuyen la friccin entre las estructuras as Neurosurgeoncomo el estrs que se aplica sobre los tejidos blandos. La bursitis prerrotuliana es una inflamacin de la bursa que se encuentra entre la piel y la [place]ula. Esta afeccin causa dolor en la rtula. SNTOMAS  Dolor, hinchazn, o molestias debajo de la rtula.  Dolor que empeora al mover la articulacin de la rodilla.  Disminucin de la amplitud de movimientos de la articulacin de la rodilla.  Ruido de "crack" (crepitacin) al mover o tocar la bursa.  Otras veces, la bursa se hincha pero no hay dolor.  Puede haber fiebre (en casos de infeccin). CAUSAS La causa de la bursitis es una lesin en la bursa, que da como resultado una respuesta inflamatoria. Los mecanismos ms frecuentes de la lesin son:  Traumatismo directo la zona anterior de la rodilla.  Actividad repetida o estresante de la rodilla. LOS RIESGOS AUMENTAN CON:  Actividades en las que son habituales las cadas o flexiones de las rodilla (voley o ftbol).  Entrenamiento repetitivo y Higher education careers adviserestresante, especialmente si involucra el correr sobre superficies inclinadas.  Tcnicas de entrenamiento incorrectas, incluso cambios repentinos en la intensidad, frecuencia o duracin de la [place]actividad.  No hacer un precalentamiento adecuado.  Mala tcnica.  Csped sinttico. PREVENCIN  Evite arrodillarse o caer sobre las rodillas.  Precalentamiento adecuado y elongacin antes de la [place] Springsactividad.  Descanso y recuperacin entre actividades.  Mantener la forma fsica:  Earma ReadingFuerza, flexibilidad y resistencia muscular.  Capacidad cardiovascular.  Aprenda y USAAutilice las tcnicas adecuadas. Cuando sea posible, cuente con un entrenador que corrija las tcnicas incorrectas.  Use el equipo protector adecuado y que le ajuste bien (protectores para las rodillas). PRONSTICO Si se trata adecuadamente,  generalmente es curable dentro de las 2 semanas. COMPLICACIONES RELACIONADAS  La recurrencia frecuente de los sntomas puede dar como resultado un problema crnico.  Si no se trata adecuadamente, la curacin demorar ms tiempo.  Amplitud de movimientos limitada.  Infeccin de la bursa.  Inflamacin crnica o cicatrizacin de la bursa. TRATAMIENTO El tratamiento inicial incluye el uso de medicamentos y la aplicacin de hielo para reducir Chief Technology Officerel dolor y la inflamacin. Los ejercicios de elongacin y fortalecimiento pueden ayudar a reducir Chief Technology Officerel dolor con la actividad, en especial los cudriceps y los isquiotibiales. Los ejercicios pueden Management consultantrealizarse en el hogar o con un terapeuta. El mdico podr indicarle el uso de rodilleras cuando retorne a la prctica de deportes, con el objeto de reducir el estrs en la bursa prerrotuliana. Si los sntomas persisten a pesar del tratamiento, el mdico podr drenar (aspirar) el lquido de la bursa con una aguja. Si los sntomas persisten por ms de 6 meses de tratamiento no quirrgico (conservador), se indicar la ciruga para extirpar la bursa.  MEDICAMENTOS   Si necesita analgsicos, se recomiendan los antiinflamatorios no esteroides, como aspirina e ibuprofeno y otros calmantes menores, como acetaminofeno  No tome medicamentos para Chief Technology Officerel dolor dentro de los [address] das previos a la Azerbaijanciruga.  Los analgsicos prescriptos se indicarn si el mdico lo considera necesario. Utilcelos como se le indique y slo cuando lo necesite.  En algunos casos se indica una inyeccin  de corticosteroides. Estas inyecciones deben reservarse para los [place]asos graves, porque slo se pueden administrar una determinada cantidad de veces. CALOR Y FRO   El tratamiento con fro EchoStaralivia el dolor y reduce la inflamacin. El fro debe aplicarse durante 10 a 15 minutos cada 2  3 horas para reducir la inflamacin y Chief Technology Officerel dolor e inmediatamente despus de cualquier actividad que agrava los sntomas. Utilice  bolsas de hielo o masajee la zona con un trozo de hielo (masaje de hielo).  El calor puede usarse antes de Therapist, musicelongar y de las actividades de fortalecimiento indicadas por el profesional, le fisioterapeuta o Orthoptistel entrenador. Utilice una bolsa trmica o sumerja la lesin en agua caliente. SOLICITE ATENCIN MDICA SI:  El tratamiento no Radiographer, therapeuticle da buenos resultados o el problema Palmarejoempeora.  Algn medicamento le produce BB&T Corporationefectos adversos. EJERCICIOS EJERCICIOS DE AMPLITUD DE MOVIMIENTOS Y ELONGACIN - Bursitis prerrotuliana Estos ejercicios lo ayudarn al comienzo de la rehabilitacin. Los sntomas podrn aliviarse con o sin asistencia adicional de su mdico, fisioterapeuta o Herbalistentrenador. Al completar estos ejercicios, recuerde:   Restaurar la flexibilidad del tejido ayuda a que las articulaciones recuperen el movimiento normal. Esto permite que el movimiento y la actividad sea ms saludables y menos dolorosos.  Para que sea efectiva, cada elongacin debe realizarse durante al menos 30 segundos.  La elongacin nunca debe ser dolorosa. Deber sentir slo un alargamiento o distensin suave del tejido que estira. ELONGACIN - Isquiosurales de pie  Estando de pie o sentado, extienda la pierna derecha / izquierdo, colocando el pie en una silla o banco.  Doble ligeramente la espalda y las caderas, formando un arco suave hacia adelante.  Lleve el pecho hacia adelante hasta sentir un ligero estiramiento en la zona posterior de la rodilla o muslo derecha / izquierdo. (Si se realiza correctamente, este ejercicio requiere inclinarse muy poco hacia adelante).  Mantenga esta posicin durante __________ segundos. Reptalo __________ veces. Realice este estiramiento __________ Anthoney Haradaveces por da. FUERZA - Cudriceps - en posicin prona  Recustese sobre el [address] Stestmago sobre una superficie firme, como una cama o la mesada de la cocina.  Incline su rodilla derecha / izquierdo y tmese del tobillo. Si no puede alcanzar el  tobillo o la pierna del pantaln, use un cinturn alrededor del pie para aumentar su alcance.  Deslice lentamente el taln hacia las nalgas. La rodilla no debe deslizarse hacia el lado. Debe sentir un estiramiento en la parte anterior del muslo o de la rodilla.  Mantenga esta posicin durante __________ segundos. Reptalo __________ veces. Realice este estiramiento __________ Anthoney Haradaveces por da.  ELONGACIN - Isquiosurales y aductores - sentado en forma de V  Sintese en el piso con las piernas extendidas en forma de una gran "V" manteniendo las rodillas rectas.  Con la cabeza y el pecho erguidos, doble la cintura y trate de Adult nursellegar hasta el pie derecho para Therapist, musicelongar los aductores izquierdos.  Debe sentir un estiramiento en la zona interna del muslo izquierdo. Mantenga esta posicin durante __________ segundos.  Vuelva a la posicin erguida para relajar los msculos de las piernas.  Contine en esta posicin erguida, doble la cintura para elongar los tendones.  Debe sentir un estiramiento en la parte posterior de los muslos o de las rodillas. Mantenga esta posicin durante __________ segundos.  Vuelva a la posicin erguida para relajar los msculos de las piernas.  Repita los pasos 2 a 4. Reptalo __________ veces. Realice este ejercicio __________ veces por da.  EJERCICIOS DE FORTALECIMIENTO - Bursitis prerrotuliana  Estos  ejercicios lo ayudarn al comienzo de la rehabilitacin. Los sntomas podrn aliviarse con o sin asistencia adicional de su mdico, fisioterapeuta o Herbalist. Al completar estos ejercicios, recuerde:  Los msculos pueden ganar tanto la resistencia como la fuerza que necesita para sus actividades diarias a travs de ejercicios controlados.  Realice los ejercicios como se lo indic el mdico, el fisioterapeuta o Orthoptist. Aumente la resistencia y las repeticiones segn se le haya indicado. FUERZA - Cudriceps - isometricos  Recustese sobre la espalda con su pierna  derecha / izquierdo extendida y la rodilla opuesta doblada.  Tensione gradualmente los msculos de la zona anterior del muslo derecha / izquierdo. Ver que la rtula se desliza hacia arriba o que se intensifica el hoyuelo que se encuentra justo por arriba de la rodilla. Este movimiento empujar nuevamente la rodilla hacia abajo en direccin al piso, [place] o cama sobre la cual est recostado.  Sostenga de ese modo el msculo, tan apretado como pueda, sin que [employer], durante __________ segundos.  Relaje los msculos lenta y completamente entre cada repeticin. Reptalo __________ veces. Realice este ejercicio __________ veces por da.  FUERZA - Cudriceps - arcos cortos  Acustese sobre la espada. Coloque una toalla enrollada de __________ pulgadas debajo de su rodilla, de modo que se doble ligeramente.  Eleve slo la parte inferior de la pierna mediante la tensin de los msculos de la parte frontal del muslo . No permita que el muslo se eleve.  Mantenga esta posicin durante __________ segundos. Reptalo __________ veces. Realice este ejercicio __________ veces por da.  PESO OPCIONAL EN EL TOBILLO: Comience con ____________________, pero no se exceda de ____________________. Aumente de a 1 libra/0.5 kilograme. FUERZA - Cudriceps - Levantar las piernas rectas La calidad vale! Observe si el cudriceps est trabajando para estar seguro que est fortaleciendo los msculos correctos y no "haciendo trampa" trabajando con msculos sanos.  Recustese sobre la espalda con su pierna derecha / izquierdo extendida y la rodilla opuesta doblada.  Tensione gradualmente los msculos de la zona anterior del muslo derecha / izquierdo. Ver que la rtula se desliza hacia arriba o que se intensifica el hoyuelo que se encuentra justo por arriba de la rodilla. Podr sentir que el muslo tiembla.  Tensione estos msculos an ms y [employer] pierna 10 a 15 cm del piso. Mantenga esta posicin durante  __________ segundos.  Mientras mantiene estos msculos tensionados, baje la pierna.  Relaje los msculos lenta y completamente entre cada repeticin. Reptalo __________ veces. Realice este ejercicio __________ veces por da.  FUERZA - Cudriceps - Step-Ups   Utilice un libro grueso, un step o apoyapis que sea de __________ [employer].  Sostngase de una pared o mesada slo para mantener el equilibrio, no para apoyarse.  Suba lentamente con el pie derecha / izquierdo, manteniendo la rodilla en lnea con la cadera y el pie. No deje que la rodilla se doble de modo que no pueda ver sus dedos.  Destrabe lentamente la rodilla y baje hasta la posicin inicial. Debe bajar usando los msculos y no la gravedad. Reptalo __________ veces. Realice este ejercicio __________ veces por da.  Document Released: 12/25/2005 Document Revised: 06/02/2011 [facility] Patient Information 2015 [place], [place]. This information is not intended to replace advice given to you by your health care provider. Make sure you discuss any questions you have with your health care provider.

## 2014-03-02 NOTE — Progress Notes (Signed)
Subjective:  This chart was scribed for Travis Sorenson, MD by Charline Bills, ED Scribe. The patient was seen in room 11. Patient's care was started at 10:18 AM.   Patient ID: Travis Ryan, male    DOB: [birth year], 38 y.o.   MRN: [id number]  Chief Complaint  Patient presents with  . Knee Pain    recurent    left knee   HPI HPI Comments: Travis Ryan is a 38 y.o. male who presents to the [facility] complaining of recurrent L knee pain. Pt was seen 3 weeks ago by the Sports Fellow physician Dr. 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 Pt usually wears knee pads. Pt started on Mobic for 2 weeks with ice and compression. Offered aspiration but pt declined. Pt reports associated L knee swelling and a constant tingling, pinching sensation in his L heel over the past 4 days. Symptoms are worsened as the day progresses. He denies weakness, numbness, polydipsia, polyphagia, unexpected weight change, back pain, any urinary symptoms. No OTC medications tried. Pt has tried applying ice with temporary relief. He also states that he tried using an Ace bandage but took it off since it caused bruising to his knee. No medical history or family h/o blood clots.   No past medical history on file.   Current Outpatient Prescriptions on File Prior to Visit  Medication Sig Dispense Refill  . azithromycin (ZITHROMAX) 250 MG tablet Take all 4 tabs PO now 4 tablet 0  . cyclobenzaprine (FLEXERIL) 10 MG tablet Take 1 tablet (10 mg total) by mouth at bedtime as needed for muscle spasms. May make you drowsy 30 tablet 0  . meloxicam (MOBIC) 15 MG tablet Take 1 tablet (15 mg total) by mouth daily. Take 1 pill daily X 14 days then as needed 30 tablet 0   No current facility-administered medications on file prior to visit.   No Known Allergies   Review of Systems  Constitutional: Negative for unexpected weight  change.  Endocrine: Negative for polydipsia and polyphagia.  Genitourinary: Negative.   Musculoskeletal: Positive for joint swelling and arthralgias. Negative for back pain.  Neurological: Negative for weakness and numbness.   BP 120/88 mmHg  Pulse 73  Temp(Src) 98.6 F (37 C) (Oral)  Resp 16  Ht 5' 9.5" (1.765 m)  Wt 213 lb 3.2 oz (96.707 kg)  BMI 31.04 kg/m2  SpO2 96%    Objective:   Physical Exam  Constitutional: He is oriented to person, place, and time. He appears well-developed and well-nourished. No distress.  HENT:  Head: Normocephalic and atraumatic.  Eyes: Conjunctivae and EOM are normal.  Neck: Neck supple. No tracheal deviation present.  Cardiovascular: Normal rate.   Pulses:      Dorsalis pedis pulses are 2+ on the left side.       Posterior tibial pulses are 2+ on the left side.  Pulmonary/Chest: Effort normal. No respiratory distress.  Musculoskeletal: Normal range of motion.  Large bakers cyst in L popliteal fossa.  15.5 inches circumference on L, 15 inches on R.  Neurological: He is alert and oriented to person, place, and time.  Skin: Skin is warm and dry.  Psychiatric: He has a normal mood and affect. His behavior is normal.  Nursing note and vitals reviewed.  Results for orders placed or performed in visit on 03/02/14  POCT CBC  Result Value  Ref Range   WBC 8.6 4.6 - 10.2 K/uL   Lymph, poc 2.8 0.6 - 3.4   POC LYMPH PERCENT 32.8 10 - 50 %L   MID (cbc) 0.6 0 - 0.9   POC MID % 7.3 0 - 12 %M   POC Granulocyte 5.41 2 - 6.9   Granulocyte percent 59.9 37 - 80 %G   RBC 5.41 4.69 - 6.13 M/uL   Hemoglobin 16.1 14.1 - 18.1 g/dL   HCT, POC 28.4 13.2 - 53.7 %   MCV 87.1 80 - 97 fL   MCH, POC 29.7 27 - 31.2 pg   MCHC 34.2 31.8 - 35.4 g/dL   RDW, POC 44.0 %   Platelet Count, POC 168 142 - 424 K/uL   MPV 8.6 0 - 99.8 fL  POCT glucose (manual entry)  Result Value Ref Range   POC Glucose 98 70 - 99 mg/dl      Assessment & Plan:  Swelling of left lower  extremity - Plan: POCT CBC, POCT glucose (manual entry), POCT SEDIMENTATION RATE, Lower Extremity Venous Duplex Left, AMB referral to sports medicine - push fluids and increase protein in diet, avoid salt, stat US today neg for DVT.  Tingling in extremities - Plan: POCT CBC, POCT glucose (manual entry), POCT SEDIMENTATION RATE, Lower Extremity Venous Duplex Left  Prepatellar bursitis, left - Plan: AMB referral to sports medicine - start gulcosamine-chondroiton supp and meloxicam qd, RICE.  Baker cyst, left - Plan: AMB referral to sports medicine Recommend trying to establish w/ [facility] since may need MRI which would be cost-prohibitive at this point as he does not have health insurance.   I personally performed the services described in this documentation, which was scribed in my presence. The recorded information has been reviewed and considered, and addended by me as needed.  Travis Sorenson, MD MPH  Stat lower ext doppler US - no dvt, no baker's cyst

## 2014-03-21 ENCOUNTER — Encounter: Payer: Self-pay | Admitting: Sports Medicine

## 2014-03-21 ENCOUNTER — Ambulatory Visit (INDEPENDENT_AMBULATORY_CARE_PROVIDER_SITE_OTHER): Payer: Self-pay | Admitting: Sports Medicine

## 2014-03-21 VITALS — BP 146/94 | HR 71 | Ht 68.0 in | Wt 210.0 lb

## 2014-03-21 DIAGNOSIS — G8929 Other chronic pain: Secondary | ICD-10-CM | POA: Insufficient documentation

## 2014-03-21 DIAGNOSIS — M25562 Pain in left knee: Secondary | ICD-10-CM

## 2014-03-21 MED ORDER — DICLOFENAC SODIUM 75 MG PO TBEC: 75.0000 mg | DELAYED_RELEASE_TABLET | Freq: Two times a day (BID) | ORAL | Status: DC

## 2014-03-21 NOTE — Assessment & Plan Note (Signed)
Currently improved without any acute pain or swelling today. 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  Plan: 1. Refill Voltaren 75mg  BID 2. Continue Glucosamine + Chondroitin sulfate 1500mg  daily if helping 3. RTC PRN knee pain, consider future injections if needed

## 2014-03-21 NOTE — Patient Instructions (Signed)
Interpreter is Albertina SenegalMarly Adams for this visit

## 2014-03-21 NOTE — Progress Notes (Signed)
Subjective:    Patient ID: Travis Ryan, male    DOB: Jan 23, 1976, 38 y.o.   MRN: [id number]  Patient presents as new patient to [facility]. He provided majority of history speaking English but some history provided with assistance of Spanish Engineer, technical sales Pharmacist, community) present during exam.   HPI  LEFT KNEE PAIN: - Patient reports intermittent Left knee pain over past 4-6 weeks. Denies any significant prior knee injuries or surgeries. Currently significantly improved, and today without any acute Left Knee pain or swelling. 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 - Recently seen at [facility] twice within past 1 month (11/19, 12/10) for same complaint, previously diagnosed with prepatellar bursitis and Left baker's cyst, Left Knee ultrasound was negative for Baker's cyst or any other findings. Initially tried on Flexeril and Mobic, had reaction to flexeril. Switched to Voltaren 35mg  TID with good results, also started Glucosamine + Chondroitin sulfate recently with improvement.  I have reviewed and updated the following as appropriate: allergies and current medications  Social Hx: - Former smoker  Review of Systems  See above HPI    Objective:   Physical Exam  BP 146/94 mmHg  Pulse 71  Ht 5\' 8"  (1.727 m)  Wt 210 lb (95.255 kg)  BMI 31.94 kg/m2  Gen - well-appearing, NAD MSK: - Left Knee: Normal appearance to Right, no swelling, erythema or deformities. Full ROM bilaterally without pain. No localized tenderness to palpation today. No crepitus or patellar grinding on ROM. Ligamentous testing intact (ACL, MCL, LCL), negative meniscal testing with McMurray's Ext - non-tender,  no edema, peripheral pulses intact +2 b/l dp Skin - warm, dry, no rashes Neuro - intact muscle strength 5/5 b/l quads, hamstring, hip flexors, intact distal sensation to light touch, gait normal     Assessment & Plan:   88 yr M without significant PMH presents for subacute Left knee pain x 4-6 weeks, currently improved without any acute pain or swelling today. 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  Plan: 1. Refill Voltaren 75mg  BID 2. Continue Glucosamine + Chondroitin sulfate 1500mg  daily if helping 3. RTC PRN knee pain, consider future injections if needed  Saralyn Pilar, DO [facility], PGY-2  Patient seen with the resident. I agree with the above plan of care.

## 2014-05-14 ENCOUNTER — Ambulatory Visit (INDEPENDENT_AMBULATORY_CARE_PROVIDER_SITE_OTHER): Payer: Self-pay | Admitting: Emergency Medicine

## 2014-05-14 VITALS — BP 124/90 | HR 121 | Temp 102.7°F | Resp 28 | Ht 69.0 in | Wt 201.4 lb

## 2014-05-14 DIAGNOSIS — J1189 Influenza due to unidentified influenza virus with other manifestations: Secondary | ICD-10-CM

## 2014-05-14 DIAGNOSIS — J111 Influenza due to unidentified influenza virus with other respiratory manifestations: Secondary | ICD-10-CM

## 2014-05-14 MED ORDER — HYDROCOD POLST-CHLORPHEN POLST 10-8 MG/5ML PO LQCR: 5.0000 mL | Freq: Two times a day (BID) | ORAL | Status: DC | PRN

## 2014-05-14 MED ORDER — OSELTAMIVIR PHOSPHATE 75 MG PO CAPS: 75.0000 mg | ORAL_CAPSULE | Freq: Two times a day (BID) | ORAL | Status: DC

## 2014-05-14 NOTE — Patient Instructions (Signed)

## 2014-05-14 NOTE — Progress Notes (Signed)
[facility] [address], [place] [address] [phone]  Date:  05/14/2014   Name:  Travis GriffithsRoberto Cerda   DOB:  [birth year]   MRN:  [id number]  PCP:  No PCP Per Patient    Chief Complaint: Generalized Body Aches; Fatigue; Fever; Cough; and Headache   History of Present Illness:  Travis GriffithsRoberto Cerda is a 39 y.o. very pleasant male patient who presents with the following:  Ill since Friday when suddenly developed fever, myalgias and fatigue Has a non productive cough no wheezing or shortness of breath No nausea or vomiting No stool change Moderate fever and chills. No flu shot. No coryza No sore throat. No improvement with over the counter medications or other home remedies.  Denies other complaint or health concern today.   Patient Active Problem List   Diagnosis Date Noted  . Chronic pain of left knee 03/21/2014  . Prepatellar bursitis of left knee 02/09/2014    History reviewed. No pertinent past medical history.  History reviewed. No pertinent past surgical history.  History  Substance Use Topics  . Smoking status: Former Smoker    Start date: 12/24/1999  . Smokeless tobacco: Never Used  . Alcohol Use: 7.2 oz/week    12 Cans of beer per week     Comment: socal    Family History  Problem Relation Age of Onset  . Cancer Maternal Grandmother   . Early death Maternal Grandfather   . Cancer Paternal Grandmother   . Early death Paternal Grandfather     No Known Allergies  Medication list has been reviewed and updated.  Current Outpatient Prescriptions on File Prior to Visit  Medication Sig Dispense Refill  . diclofenac (VOLTAREN) 75 MG EC tablet Take 1 tablet (75 mg total) by mouth 2 (two) times daily with a meal. 60 tablet 3  . glucosamine-chondroitin 500-400 MG tablet Take 1 tablet by mouth 3 (three) times daily.     No current facility-administered medications on file prior to visit.    Review of Systems:  As per HPI, otherwise  negative.    Physical Examination: Filed Vitals:   05/14/14 1546  BP: 124/90  Pulse: 121  Temp: 102.7 F (39.3 C)  Resp: 28   Filed Vitals:   05/14/14 1546  Height: 5\' 9"  (1.753 m)  Weight: 201 lb 6 oz (91.343 kg)   Body mass index is 29.72 kg/(m^2). Ideal Body Weight: Weight in (lb) to have BMI = 25: 168.9  GEN: WDWN, NAD, Non-toxic, A & O x 3 HEENT: Atraumatic, Normocephalic. Neck supple. No masses, No LAD. Ears and Nose: No external deformity. CV: RRR, No M/G/R. No JVD. No thrill. No extra heart sounds. PULM: CTA B, no wheezes, crackles, rhonchi. No retractions. No resp. distress. No accessory muscle use. ABD: S, NT, ND, +BS. No rebound. No HSM. EXTR: No c/c/e NEURO Normal gait.  PSYCH: Normally interactive. Conversant. Not depressed or anxious appearing.  Calm demeanor.    Assessment and Plan: ILI tamiflu tussionex Antipyretics  Signed,  Phillips OdorJeffery Amery Minasyan, MD

## 2015-06-20 ENCOUNTER — Ambulatory Visit (INDEPENDENT_AMBULATORY_CARE_PROVIDER_SITE_OTHER): Payer: Self-pay | Admitting: Family Medicine

## 2015-06-20 VITALS — BP 130/80 | HR 90 | Temp 98.3°F | Resp 20 | Wt 214.0 lb

## 2015-06-20 DIAGNOSIS — S39012A Strain of muscle, fascia and tendon of lower back, initial encounter: Secondary | ICD-10-CM

## 2015-06-20 MED ORDER — CYCLOBENZAPRINE HCL 10 MG PO TABS: 10.0000 mg | ORAL_TABLET | Freq: Three times a day (TID) | ORAL | Status: DC | PRN

## 2015-06-20 MED ORDER — MELOXICAM 15 MG PO TABS: 15.0000 mg | ORAL_TABLET | Freq: Every day | ORAL | Status: DC

## 2015-06-20 NOTE — Progress Notes (Signed)
Subjective:    Patient ID: Travis Ryan, male    DOB: 04/24/1975, 40 y.o.   MRN: [id number]  HPI This is a 40 yo male who presents today with 3 days of back pain in his low back on right side. Pain worse after sitting or sleeping. No radiation down legs. No tingling or numbness, no loss of bowel or bladder incontinence. This has happened in the past with relief from ibuprofen 400 mg every 4-6 hours. Took ibuprofen 800 mg yesterday with some relief. No neck pain.   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 He does not get regular exercise, no stretching or core work.    No past medical history on file. No past surgical history on file. Family History  Problem Relation Age of Onset  . Cancer Maternal Grandmother   . Early death Maternal Grandfather   . Cancer Paternal Grandmother   . Early death Paternal Grandfather    Social History  Substance Use Topics  . Smoking status: Former Smoker    Start date: 12/24/1999  . Smokeless tobacco: Never Used  . Alcohol Use: 7.2 oz/week    12 Cans of beer per week     Comment: socal   Review of Systems Per HPI    Objective:   Physical Exam  Constitutional: He is oriented to person, place, and time. He appears well-developed and well-nourished. No distress.  HENT:  Head: Normocephalic and atraumatic.  Right Ear: External ear normal.  Left Ear: External ear normal.  Mouth/Throat: Oropharynx is clear and moist.  Eyes: Conjunctivae are normal. Pupils are equal, round, and reactive to light.  Neck: Normal range of motion. Neck supple.  Cardiovascular: Normal rate, regular rhythm and normal heart sounds.   Pulmonary/Chest: Effort normal and breath sounds normal.  Musculoskeletal: Normal range of motion.       Cervical back: Normal.       Thoracic back: Normal.       Lumbar back: He exhibits tenderness (right sided). He exhibits normal range of motion, no bony tenderness, no swelling and no deformity.    Neurological: He is alert and oriented to person, place, and time. He has normal reflexes.  Skin: Skin is warm and dry. He is not diaphoretic.  Psychiatric: He has a normal mood and affect. His behavior is normal. Judgment and thought content normal.  Vitals reviewed.     BP 130/80 mmHg  Pulse 90  Temp(Src) 98.3 F (36.8 C) (Oral)  Resp 20  Wt 214 lb (97.07 kg)  SpO2 98% Wt Readings from Last 3 Encounters:  06/20/15 214 lb (97.07 kg)  05/14/14 201 lb 6 oz (91.343 kg)  03/21/14 210 lb (95.255 kg)       Assessment & Plan:  1. Low back strain, initial encounter - Provided written and verbal information regarding diagnosis and treatment. - This is a recurrent problem, likely due to his strenuous work, mild obesity and lack of flexibility and core strength. Provided illustrated exercises and encouraged proper lifting techniques.  - meloxicam (MOBIC) 15 MG tablet; Take 1 tablet (15 mg total) by mouth daily.  Dispense: 30 tablet; Refill: 0 - cyclobenzaprine (FLEXERIL) 10 MG tablet; Take 1 tablet (10 mg total) by mouth 3 (three) times daily as needed for muscle spasms.  Dispense: 30 tablet; Refill: 0 - RTC precautions reviewed  Olean Ree, FNP-BC  [facility]  06/23/2015 11:30 AM

## 2015-06-20 NOTE — Patient Instructions (Addendum)
Please take meloxicam once a day as needed And muscle relaxer every 8 hours as needed- may make you sleepy   Low Back Strain With Rehab A strain is an injury in which a tendon or muscle is torn. The muscles and tendons of the lower back are vulnerable to strains. However, these muscles and tendons are very strong and require a great force to be injured. Strains are classified into three categories. Grade 1 strains cause pain, but the tendon is not lengthened. Grade 2 strains include a lengthened ligament, due to the ligament being stretched or partially ruptured. With grade 2 strains there is still function, although the function may be decreased. Grade 3 strains involve a complete tear of the tendon or muscle, and function is usually impaired. SYMPTOMS   Pain in the lower back.  Pain that affects one side more than the other.  Pain that gets worse with movement and may be felt in the hip, buttocks, or back of the thigh.  Muscle spasms of the muscles in the back.  Swelling along the muscles of the back.  Loss of strength of the back muscles.  Crackling sound (crepitation) when the muscles are touched. CAUSES  Lower back strains occur when a force is placed on the muscles or tendons that is greater than they can handle. Common causes of injury include:  Prolonged overuse of the muscle-tendon units in the lower back, usually from incorrect posture.  A single violent injury or force applied to the back. RISK INCREASES WITH:  Sports that involve twisting forces on the spine or a lot of bending at the waist (football, rugby, weightlifting, bowling, golf, tennis, speed skating, racquetball, swimming, running, gymnastics, diving).  Poor strength and flexibility.  Failure to warm up properly before activity.  Family history of lower back pain or disk disorders.  Previous back injury or surgery (especially fusion).  Poor posture with lifting, especially heavy objects.  Prolonged  sitting, especially with poor posture. PREVENTION   Learn and use proper posture when sitting or lifting (maintain proper posture when sitting, lift using the knees and legs, not at the waist).  Warm up and stretch properly before activity.  Allow for adequate recovery between workouts.  Maintain physical fitness:  Strength, flexibility, and endurance.  Cardiovascular fitness. PROGNOSIS  If treated properly, lower back strains usually heal within 6 weeks. RELATED COMPLICATIONS   Recurring symptoms, resulting in a chronic problem.  Chronic inflammation, scarring, and partial muscle-tendon tear.  Delayed healing or resolution of symptoms.  Prolonged disability. TREATMENT  Treatment first involves the use of ice and medicine, to reduce pain and inflammation. The use of strengthening and stretching exercises may help reduce pain with activity. These exercises may be performed at home or with a therapist. Severe injuries may require referral to a therapist for further evaluation and treatment, such as ultrasound. Your caregiver may advise that you wear a back brace or corset, to help reduce pain and discomfort. Often, prolonged bed rest results in greater harm then benefit. Corticosteroid injections may be recommended. However, these should be reserved for the most serious cases. It is important to avoid using your back when lifting objects. At night, sleep on your back on a firm mattress with a pillow placed under your knees. If non-surgical treatment is unsuccessful, surgery may be needed.  MEDICATION   If pain medicine is needed, nonsteroidal anti-inflammatory medicines (aspirin and ibuprofen), or other minor pain relievers (acetaminophen), are often advised.  Do not take pain medicine  for 7 days before surgery.  Prescription pain relievers may be given, if your caregiver thinks they are needed. Use only as directed and only as much as you need.  Ointments applied to the skin may be  helpful.  Corticosteroid injections may be given by your caregiver. These injections should be reserved for the most serious cases, because they may only be given a certain number of times. HEAT AND COLD  Cold treatment (icing) should be applied for 10 to 15 minutes every 2 to 3 hours for inflammation and pain, and immediately after activity that aggravates your symptoms. Use ice packs or an ice massage.  Heat treatment may be used before performing stretching and strengthening activities prescribed by your caregiver, physical therapist, or athletic trainer. Use a heat pack or a warm water soak. SEEK MEDICAL CARE IF:   Symptoms get worse or do not improve in 2 to 4 weeks, despite treatment.  You develop numbness, weakness, or loss of bowel or bladder function.  New, unexplained symptoms develop. (Drugs used in treatment may produce side effects.) EXERCISES  RANGE OF MOTION (ROM) AND STRETCHING EXERCISES - Low Back Strain Most people with lower back pain will find that their symptoms get worse with excessive bending forward (flexion) or arching at the lower back (extension). The exercises which will help resolve your symptoms will focus on the opposite motion.  Your physician, physical therapist or athletic trainer will help you determine which exercises will be most helpful to resolve your lower back pain. Do not complete any exercises without first consulting with your caregiver. Discontinue any exercises which make your symptoms worse until you speak to your caregiver.  If you have pain, numbness or tingling which travels down into your buttocks, leg or foot, the goal of the therapy is for these symptoms to move closer to your back and eventually resolve. Sometimes, these leg symptoms will get better, but your lower back pain may worsen. This is typically an indication of progress in your rehabilitation. Be very alert to any changes in your symptoms and the activities in which you participated  in the 24 hours prior to the change. Sharing this information with your caregiver will allow him/her to most efficiently treat your condition.  These exercises may help you when beginning to rehabilitate your injury. Your symptoms may resolve with or without further involvement from your physician, physical therapist or athletic trainer. While completing these exercises, remember:  Restoring tissue flexibility helps normal motion to return to the joints. This allows healthier, less painful movement and activity.  An effective stretch should be held for at least 30 seconds.  A stretch should never be painful. You should only feel a gentle lengthening or release in the stretched tissue. FLEXION RANGE OF MOTION AND STRETCHING EXERCISES: STRETCH - Flexion, Single Knee to Chest   Lie on a firm bed or floor with both legs extended in front of you.  Keeping one leg in contact with the floor, bring your opposite knee to your chest. Hold your leg in place by either grabbing behind your thigh or at your knee.  Pull until you feel a gentle stretch in your lower back. Hold __________ seconds.  Slowly release your grasp and repeat the exercise with the opposite side. Repeat __________ times. Complete this exercise __________ times per day.  STRETCH - Flexion, Double Knee to Chest   Lie on a firm bed or floor with both legs extended in front of you.  Keeping one leg in  contact with the floor, bring your opposite knee to your chest.  Tense your stomach muscles to support your back and then lift your other knee to your chest. Hold your legs in place by either grabbing behind your thighs or at your knees.  Pull both knees toward your chest until you feel a gentle stretch in your lower back. Hold __________ seconds.  Tense your stomach muscles and slowly return one leg at a time to the floor. Repeat __________ times. Complete this exercise __________ times per day.  STRETCH - Low Trunk Rotation  Lie  on a firm bed or floor. Keeping your legs in front of you, bend your knees so they are both pointed toward the ceiling and your feet are flat on the floor.  Extend your arms out to the side. This will stabilize your upper body by keeping your shoulders in contact with the floor.  Gently and slowly drop both knees together to one side until you feel a gentle stretch in your lower back. Hold for __________ seconds.  Tense your stomach muscles to support your lower back as you bring your knees back to the starting position. Repeat the exercise to the other side. Repeat __________ times. Complete this exercise __________ times per day  EXTENSION RANGE OF MOTION AND FLEXIBILITY EXERCISES: STRETCH - Extension, Prone on Elbows   Lie on your stomach on the floor, a bed will be too soft. Place your palms about shoulder width apart and at the height of your head.  Place your elbows under your shoulders. If this is too painful, stack pillows under your chest.  Allow your body to relax so that your hips drop lower and make contact more completely with the floor.  Hold this position for __________ seconds.  Slowly return to lying flat on the floor. Repeat __________ times. Complete this exercise __________ times per day.  RANGE OF MOTION - Extension, Prone Press Ups  Lie on your stomach on the floor, a bed will be too soft. Place your palms about shoulder width apart and at the height of your head.  Keeping your back as relaxed as possible, slowly straighten your elbows while keeping your hips on the floor. You may adjust the placement of your hands to maximize your comfort. As you gain motion, your hands will come more underneath your shoulders.  Hold this position __________ seconds.  Slowly return to lying flat on the floor. Repeat __________ times. Complete this exercise __________ times per day.  RANGE OF MOTION- Quadruped, Neutral Spine   Assume a hands and knees position on a firm surface.  Keep your hands under your shoulders and your knees under your hips. You may place padding under your knees for comfort.  Drop your head and point your tail bone toward the ground below you. This will round out your lower back like an angry cat. Hold this position for __________ seconds.  Slowly lift your head and release your tail bone so that your back sags into a large arch, like an old horse.  Hold this position for __________ seconds.  Repeat this until you feel limber in your lower back.  Now, find your "sweet spot." This will be the most comfortable position somewhere between the two previous positions. This is your neutral spine. Once you have found this position, tense your stomach muscles to support your lower back.  Hold this position for __________ seconds. Repeat __________ times. Complete this exercise __________ times per day.  STRENGTHENING EXERCISES -  Low Back Strain These exercises may help you when beginning to rehabilitate your injury. These exercises should be done near your "sweet spot." This is the neutral, low-back arch, somewhere between fully rounded and fully arched, that is your least painful position. When performed in this safe range of motion, these exercises can be used for people who have either a flexion or extension based injury. These exercises may resolve your symptoms with or without further involvement from your physician, physical therapist or athletic trainer. While completing these exercises, remember:   Muscles can gain both the endurance and the strength needed for everyday activities through controlled exercises.  Complete these exercises as instructed by your physician, physical therapist or athletic trainer. Increase the resistance and repetitions only as guided.  You may experience muscle soreness or fatigue, but the pain or discomfort you are trying to eliminate should never worsen during these exercises. If this pain does worsen, stop and make  certain you are following the directions exactly. If the pain is still present after adjustments, discontinue the exercise until you can discuss the trouble with your caregiver. STRENGTHENING - Deep Abdominals, Pelvic Tilt  Lie on a firm bed or floor. Keeping your legs in front of you, bend your knees so they are both pointed toward the ceiling and your feet are flat on the floor.  Tense your lower abdominal muscles to press your lower back into the floor. This motion will rotate your pelvis so that your tail bone is scooping upwards rather than pointing at your feet or into the floor.  With a gentle tension and even breathing, hold this position for __________ seconds. Repeat __________ times. Complete this exercise __________ times per day.  STRENGTHENING - Abdominals, Crunches   Lie on a firm bed or floor. Keeping your legs in front of you, bend your knees so they are both pointed toward the ceiling and your feet are flat on the floor. Cross your arms over your chest.  Slightly tip your chin down without bending your neck.  Tense your abdominals and slowly lift your trunk high enough to just clear your shoulder blades. Lifting higher can put excessive stress on the lower back and does not further strengthen your abdominal muscles.  Control your return to the starting position. Repeat __________ times. Complete this exercise __________ times per day.  STRENGTHENING - Quadruped, Opposite UE/LE Lift   Assume a hands and knees position on a firm surface. Keep your hands under your shoulders and your knees under your hips. You may place padding under your knees for comfort.  Find your neutral spine and gently tense your abdominal muscles so that you can maintain this position. Your shoulders and hips should form a rectangle that is parallel with the floor and is not twisted.  Keeping your trunk steady, lift your right hand no higher than your shoulder and then your left leg no higher than your  hip. Make sure you are not holding your breath. Hold this position __________ seconds.  Continuing to keep your abdominal muscles tense and your back steady, slowly return to your starting position. Repeat with the opposite arm and leg. Repeat __________ times. Complete this exercise __________ times per day.  STRENGTHENING - Lower Abdominals, Double Knee Lift  Lie on a firm bed or floor. Keeping your legs in front of you, bend your knees so they are both pointed toward the ceiling and your feet are flat on the floor.  Tense your abdominal muscles to brace your  lower back and slowly lift both of your knees until they come over your hips. Be certain not to hold your breath.  Hold __________ seconds. Using your abdominal muscles, return to the starting position in a slow and controlled manner. Repeat __________ times. Complete this exercise __________ times per day.  POSTURE AND BODY MECHANICS CONSIDERATIONS - Low Back Strain Keeping correct posture when sitting, standing or completing your activities will reduce the stress put on different body tissues, allowing injured tissues a chance to heal and limiting painful experiences. The following are general guidelines for improved posture. Your physician or physical therapist will provide you with any instructions specific to your needs. While reading these guidelines, remember:  The exercises prescribed by your provider will help you have the flexibility and strength to maintain correct postures.  The correct posture provides the best environment for your joints to work. All of your joints have less wear and tear when properly supported by a spine with good posture. This means you will experience a healthier, less painful body.  Correct posture must be practiced with all of your activities, especially prolonged sitting and standing. Correct posture is as important when doing repetitive low-stress activities (typing) as it is when doing a single  heavy-load activity (lifting). RESTING POSITIONS Consider which positions are most painful for you when choosing a resting position. If you have pain with flexion-based activities (sitting, bending, stooping, squatting), choose a position that allows you to rest in a less flexed posture. You would want to avoid curling into a fetal position on your side. If your pain worsens with extension-based activities (prolonged standing, working overhead), avoid resting in an extended position such as sleeping on your stomach. Most people will find more comfort when they rest with their spine in a more neutral position, neither too rounded nor too arched. Lying on a non-sagging bed on your side with a pillow between your knees, or on your back with a pillow under your knees will often provide some relief. Keep in mind, being in any one position for a prolonged period of time, no matter how correct your posture, can still lead to stiffness. PROPER SITTING POSTURE In order to minimize stress and discomfort on your spine, you must sit with correct posture. Sitting with good posture should be effortless for a healthy body. Returning to good posture is a gradual process. Many people can work toward this most comfortably by using various supports until they have the flexibility and strength to maintain this posture on their own. When sitting with proper posture, your ears will fall over your shoulders and your shoulders will fall over your hips. You should use the back of the chair to support your upper back. Your lower back will be in a neutral position, just slightly arched. You may place a small pillow or folded towel at the base of your lower back for support.  When working at a desk, create an environment that supports good, upright posture. Without extra support, muscles tire, which leads to excessive strain on joints and other tissues. Keep these recommendations in mind: CHAIR:  A chair should be able to slide under  your desk when your back makes contact with the back of the chair. This allows you to work closely.  The chair's height should allow your eyes to be level with the upper part of your monitor and your hands to be slightly lower than your elbows. BODY POSITION  Your feet should make contact with the floor. If  this is not possible, use a foot rest.  Keep your ears over your shoulders. This will reduce stress on your neck and lower back. INCORRECT SITTING POSTURES  If you are feeling tired and unable to assume a healthy sitting posture, do not slouch or slump. This puts excessive strain on your back tissues, causing more damage and pain. Healthier options include:  Using more support, like a lumbar pillow.  Switching tasks to something that requires you to be upright or walking.  Talking a brief walk.  Lying down to rest in a neutral-spine position. PROLONGED STANDING WHILE SLIGHTLY LEANING FORWARD  When completing a task that requires you to lean forward while standing in one place for a long time, place either foot up on a stationary 2-4 inch high object to help maintain the best posture. When both feet are on the ground, the lower back tends to lose its slight inward curve. If this curve flattens (or becomes too large), then the back and your other joints will experience too much stress, tire more quickly, and can cause pain. CORRECT STANDING POSTURES Proper standing posture should be assumed with all daily activities, even if they only take a few moments, like when brushing your teeth. As in sitting, your ears should fall over your shoulders and your shoulders should fall over your hips. You should keep a slight tension in your abdominal muscles to brace your spine. Your tailbone should point down to the ground, not behind your body, resulting in an over-extended swayback posture.  INCORRECT STANDING POSTURES  Common incorrect standing postures include a forward head, locked knees and/or an  excessive swayback. WALKING Walk with an upright posture. Your ears, shoulders and hips should all line-up. PROLONGED ACTIVITY IN A FLEXED POSITION When completing a task that requires you to bend forward at your waist or lean over a low surface, try to find a way to stabilize 3 out of 4 of your limbs. You can place a hand or elbow on your thigh or rest a knee on the surface you are reaching across. This will provide you more stability so that your muscles do not fatigue as quickly. By keeping your knees relaxed, or slightly bent, you will also reduce stress across your lower back. CORRECT LIFTING TECHNIQUES DO :   Assume a wide stance. This will provide you more stability and the opportunity to get as close as possible to the object which you are lifting.  Tense your abdominals to brace your spine. Bend at the knees and hips. Keeping your back locked in a neutral-spine position, lift using your leg muscles. Lift with your legs, keeping your back straight.  Test the weight of unknown objects before attempting to lift them.  Try to keep your elbows locked down at your sides in order get the best strength from your shoulders when carrying an object.  Always ask for help when lifting heavy or awkward objects. INCORRECT LIFTING TECHNIQUES DO NOT:   Lock your knees when lifting, even if it is a small object.  Bend and twist. Pivot at your feet or move your feet when needing to change directions.  Assume that you can safely pick up even a paper clip without proper posture.   This information is not intended to replace advice given to you by your health care provider. Make sure you discuss any questions you have with your health care provider.   Document Released: 03/10/2005 Document Revised: 03/31/2014 Document Reviewed: 06/22/2008 Elsevier Interactive Patient Education 2016  [employer].     IF you received an x-ray today, you will receive an invoice from [facility]. Please  contact [facility] at [phone] with questions or concerns regarding your invoice.   IF you received labwork today, you will receive an invoice from [employer]. Please contact [facility] at [phone] with questions or concerns regarding your invoice.   Our billing staff will not be able to assist you with questions regarding bills from these companies.  You will be contacted with the lab results as soon as they are available. The fastest way to get your results is to activate your My Chart account. Instructions are located on the last page of this paperwork. If you have not heard from [place] regarding the results in 2 weeks, please contact this office.

## 2015-11-19 IMAGING — CR DG ABDOMEN 1V
1 series · 1 of 1 positions shown · non-contrast
Comparison: None.

EXAM:
ABDOMEN - 1 VIEW

[AP]
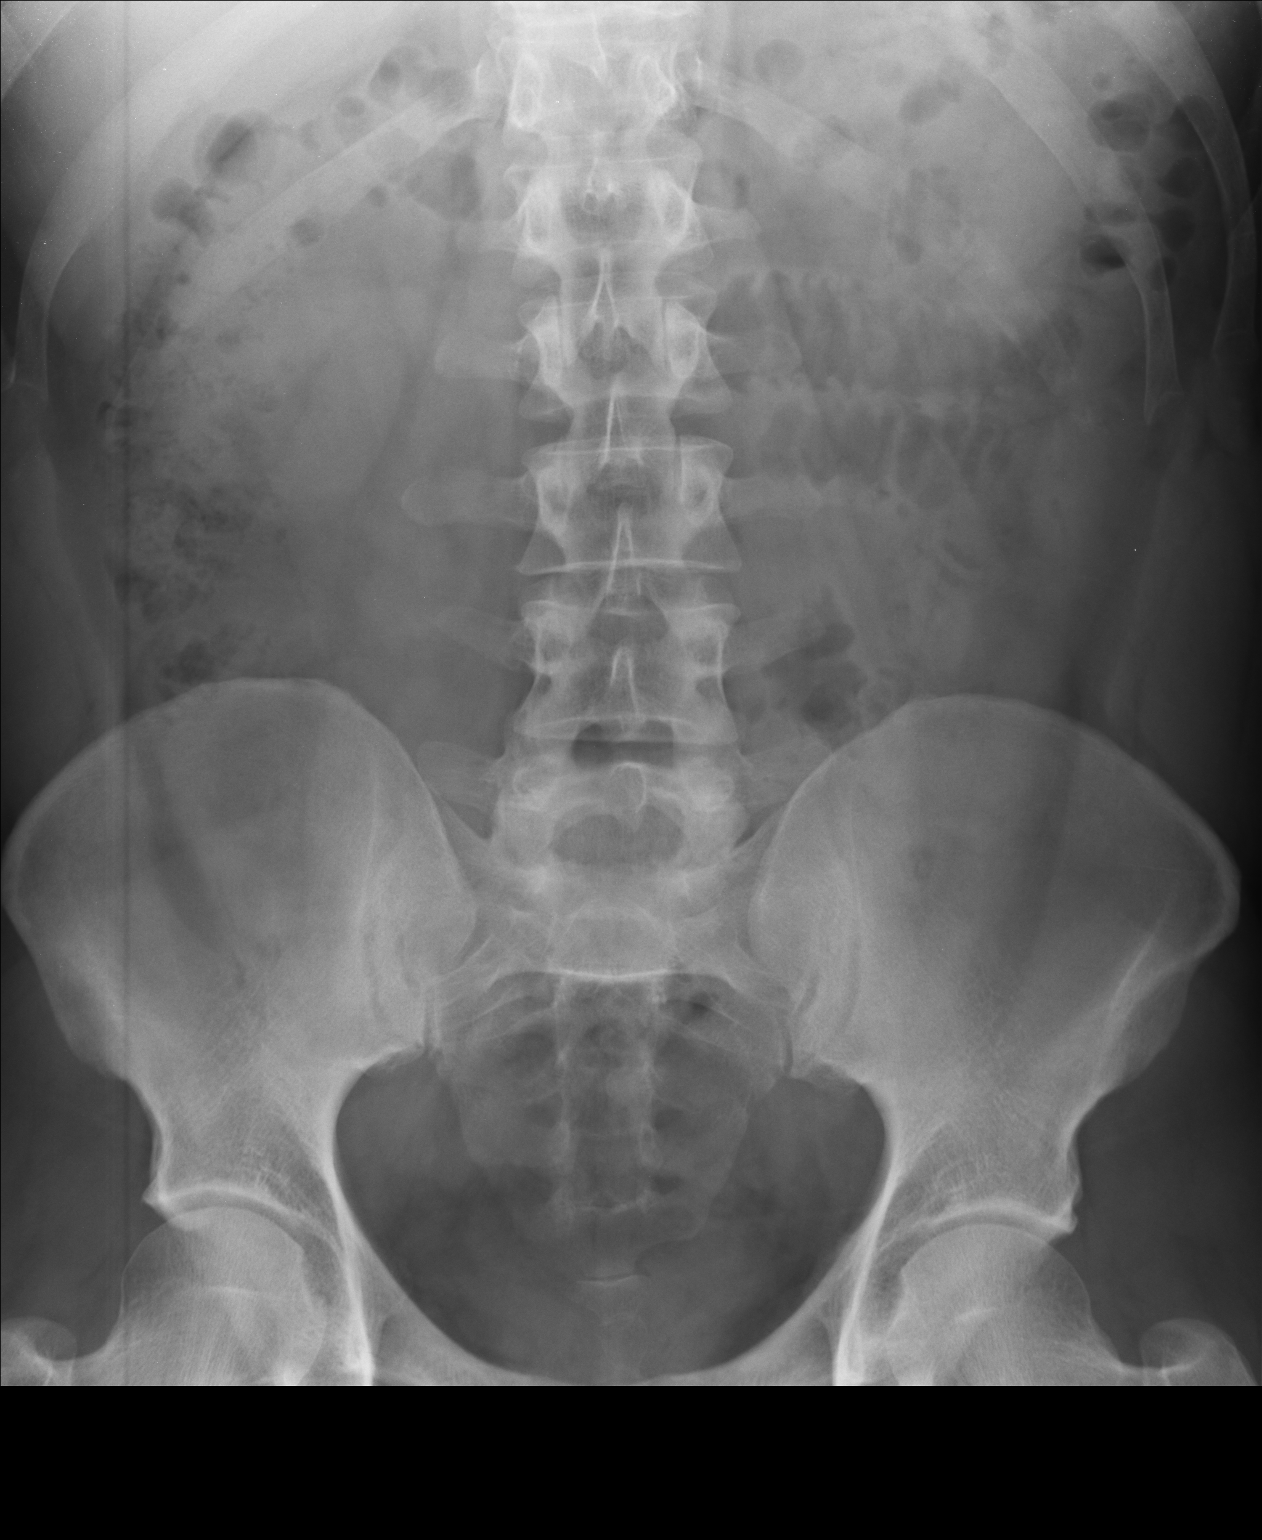

[1 of 1 positions shown; findings below may reference images not displayed]

FINDINGS: The bowel gas pattern is normal. No radio-opaque calculi or other
significant radiographic abnormality are seen.
IMPRESSION: Negative.

## 2016-03-24 HISTORY — PX: UMBILICAL HERNIA REPAIR: SUR1181

## 2017-08-13 ENCOUNTER — Encounter (HOSPITAL_COMMUNITY): Payer: Self-pay | Admitting: Emergency Medicine

## 2017-08-13 ENCOUNTER — Ambulatory Visit (HOSPITAL_COMMUNITY)
Admission: EM | Admit: 2017-08-13 | Discharge: 2017-08-13 | Disposition: A | Discharge status: Home / Self Care | Payer: Self-pay | Attending: Family Medicine | Admitting: Family Medicine

## 2017-08-13 DIAGNOSIS — R1032 Left lower quadrant pain: Secondary | ICD-10-CM

## 2017-08-13 LAB — POCT URINALYSIS DIP (DEVICE)
BILIRUBIN URINE: NEGATIVE
Glucose, UA: NEGATIVE mg/dL
Ketones, ur: NEGATIVE mg/dL
LEUKOCYTES UA: NEGATIVE
Nitrite: NEGATIVE
Protein, ur: 30 mg/dL — AB
Specific Gravity, Urine: 1.02 (ref 1.005–1.030)
UROBILINOGEN UA: 1 mg/dL (ref 0.0–1.0)
pH: 6.5 (ref 5.0–8.0)

## 2017-08-13 MED ORDER — TAMSULOSIN HCL 0.4 MG PO CAPS: 0.4000 mg | ORAL_CAPSULE | Freq: Every day | ORAL | 0 refills | Status: AC

## 2017-08-13 MED ORDER — ACETAMINOPHEN 500 MG PO TABS: 1000.0000 mg | ORAL_TABLET | Freq: Four times a day (QID) | ORAL | 0 refills | Status: DC | PRN

## 2017-08-13 NOTE — Discharge Instructions (Signed)
Your abdominal pain is concerning for possible hernia.  Please follow-up with [facility] for outpatient evaluation.  Please go to emergency room if pain worsening, developing fever, nausea, vomiting.  In the meantime please take Tylenol 502,000 mg every 4-6 hours for pain.

## 2017-08-13 NOTE — ED Provider Notes (Signed)
[facility]    CSN: [id number] Arrival date & time: 08/13/17  1006     History   Chief Complaint Chief Complaint  Patient presents with  . Abdominal Pain    HPI Brendan Gadson is a 42 y.o. male no significant past medical history presenting today for evaluation of left lower quadrant abdominal pain.  Patient states that pain began 2 days ago.  He states that on that day he was lifting a lot of heavy boxes, pain began a few hours later as he was driving home from [place].  Pain worse with movement, worse with Valsalva.  Also notices the abdominal pain with urination, but denies dysuria or increased frequency.  Denies hematuria or previous kidney stone.  Later that evening after onset of abdominal pain he felt subjective fevers and felt chills.  Has had decreased appetite, has tolerated an apple.  Denies nausea or vomiting.  3 episodes of diarrhea yesterday.  Taking ibuprofen.  Denies shortness of breath or chest pain.  Pain is significantly improved today compared to yesterday.  HPI  History reviewed. No pertinent past medical history.  Patient Active Problem List   Diagnosis Date Noted  . Chronic pain of left knee 03/21/2014  . Prepatellar bursitis of left knee 02/09/2014    History reviewed. No pertinent surgical history.     Home Medications    Prior to Admission medications   Medication Sig Start Date End Date Taking? Authorizing Provider  acetaminophen (TYLENOL) 500 MG tablet Take 2 tablets (1,000 mg total) by mouth every 6 (six) hours as needed. 08/13/17   Chyenne Sobczak C, PA-C  cyclobenzaprine (FLEXERIL) 10 MG tablet Take 1 tablet (10 mg total) by mouth 3 (three) times daily as needed for muscle spasms. 06/20/15   Emi Belfast, FNP  fexofenadine (ALLEGRA) 180 MG tablet Take 180 mg by mouth daily.    [provider]  glucosamine-chondroitin 500-400 MG tablet Take 1 tablet by mouth 3 (three) times daily. Reported on 06/20/2015    [provider]  meloxicam (MOBIC) 15 MG tablet Take 1 tablet (15 mg total) by mouth daily. 06/20/15   Emi Belfast, FNP  tamsulosin (FLOMAX) 0.4 MG CAPS capsule Take 1 capsule (0.4 mg total) by mouth daily for 15 days. 08/13/17 08/28/17  Alea Ryer, Junius Creamer, PA-C    Family History Family History  Problem Relation Age of Onset  . Cancer Maternal Grandmother   . Early death Maternal Grandfather   . Cancer Paternal Grandmother   . Early death Paternal Grandfather     Social History Social History   Tobacco Use  . Smoking status: Former Smoker    Start date: 12/24/1999  . Smokeless tobacco: Never Used  Substance Use Topics  . Alcohol use: Yes    Alcohol/week: 7.2 oz    Types: 12 Cans of beer per week    Comment: socal  . Drug use: No     Allergies   Patient has no known allergies.   Review of Systems Review of Systems  Constitutional: Positive for appetite change, fatigue and fever. Negative for activity change.  Respiratory: Negative for cough and shortness of breath.   Cardiovascular: Negative for chest pain and leg swelling.  Gastrointestinal: Positive for abdominal pain and diarrhea. Negative for blood in stool, nausea and vomiting.  Genitourinary: Negative for dysuria, frequency, hematuria, scrotal swelling and testicular pain.  Neurological: Negative for dizziness, weakness, light-headedness, numbness and headaches.     Physical Exam Triage Vital Signs  ED Triage Vitals  Enc Vitals Group     BP 08/13/17 1035 (!) 144/95     Pulse Rate 08/13/17 1035 97     Resp 08/13/17 1035 16     Temp 08/13/17 1035 99.5 F (37.5 C)     Temp Source 08/13/17 1035 Oral     SpO2 08/13/17 1035 97 %     Weight 08/13/17 1034 223 lb (101.2 kg)     Height --      Head Circumference --      Peak Flow --      Pain Score 08/13/17 1034 5     Pain Loc --      Pain Edu? --      Excl. in GC? --    No data found.  Updated Vital Signs BP (!) 144/95   Pulse 97   Temp 99.5 F (37.5  C) (Oral)   Resp 16   Wt 223 lb (101.2 kg)   SpO2 97%   BMI 32.93 kg/m   Visual Acuity Right Eye Distance:   Left Eye Distance:   Bilateral Distance:    Right Eye Near:   Left Eye Near:    Bilateral Near:     Physical Exam  Constitutional: He appears well-developed and well-nourished.  Sitting comfortably on exam table, moving slowly between positions to avoid pain  HENT:  Head: Normocephalic and atraumatic.  Mouth/Throat: Oropharynx is clear and moist.  Eyes: Conjunctivae are normal.  Neck: Neck supple.  Cardiovascular: Normal rate and regular rhythm.  No murmur heard. Pulmonary/Chest: Effort normal and breath sounds normal. No respiratory distress.  Abdominal: Soft. There is no tenderness.  Abdomen soft, nondistended, bowel sounds present throughout abdomen, tenderness to specific area and left lower quadrant towards midline, nontender to palpation throughout the rest of abdomen, negative rebound, negative McBurney's, negative Rovsing.  No obvious bulge palpated with coughing.   Genitourinary:  Genitourinary Comments:  No inguinal hernias palpated through inguinal canal, no testicular swelling or tenderness   Musculoskeletal: He exhibits no edema.  Neurological: He is alert.  Skin: Skin is warm and dry.  Psychiatric: He has a normal mood and affect.  Nursing note and vitals reviewed.    UC Treatments / Results  Labs (all labs ordered are listed, but only abnormal results are displayed) Labs Reviewed - No data to display  EKG None  Radiology No results found.  Procedures Procedures (including critical care time)  Medications Ordered in UC Medications - No data to display  Initial Impression / Assessment and Plan / UC Course  I have reviewed the triage vital signs and the nursing notes.  Pertinent labs & imaging results that were available during my care of the patient were reviewed by me and considered in my medical decision making (see chart for  details).     UA with protein and small blood, feel kidney stone unlikely, but will provide Flomax to use for possible cause.  Feel hernia is more likely a cause although no specific bulging palpated.  Does not have pain at rest, unlikely incarcerated.  Markedly improved from yesterday.  Will have patient follow-up with surgery outpatient.  Tylenol for pain.  Advised to go to emergency room if he has any worsening pain or develops fever, nausea, vomiting.Discussed strict return precautions. Patient verbalized understanding and is agreeable with plan.  Final Clinical Impressions(s) / UC Diagnoses   Final diagnoses:  Left lower quadrant pain     Discharge Instructions  Your abdominal pain is concerning for possible hernia.  Please follow-up with [facility] for outpatient evaluation.  Please go to emergency room if pain worsening, developing fever, nausea, vomiting.  In the meantime please take Tylenol 502,000 mg every 4-6 hours for pain.    ED Prescriptions    Medication Sig Dispense Auth. Provider   acetaminophen (TYLENOL) 500 MG tablet Take 2 tablets (1,000 mg total) by mouth every 6 (six) hours as needed. 30 tablet Beatriz Settles C, PA-C   tamsulosin (FLOMAX) 0.4 MG CAPS capsule Take 1 capsule (0.4 mg total) by mouth daily for 15 days. 15 capsule Diera Wirkkala C, PA-C     Controlled Substance Prescriptions [place] Controlled Substance Registry consulted? Not Applicable   Lew Dawes, [place] 08/13/17 1128

## 2017-08-13 NOTE — ED Triage Notes (Addendum)
PT reports LLQ pain that started two days ago while carrying heavy boxes. Pain is worse when walking. Also reports LLQ pain with urination, but not burning at urethra.

## 2018-03-30 ENCOUNTER — Encounter (HOSPITAL_COMMUNITY): Payer: Self-pay | Admitting: Emergency Medicine

## 2018-03-30 ENCOUNTER — Ambulatory Visit (HOSPITAL_COMMUNITY)
Admission: EM | Admit: 2018-03-30 | Discharge: 2018-03-30 | Disposition: A | Discharge status: Home / Self Care | Payer: Self-pay | Attending: Emergency Medicine | Admitting: Emergency Medicine

## 2018-03-30 ENCOUNTER — Other Ambulatory Visit: Payer: Self-pay

## 2018-03-30 DIAGNOSIS — R059 Cough, unspecified: Secondary | ICD-10-CM

## 2018-03-30 DIAGNOSIS — R05 Cough: Secondary | ICD-10-CM | POA: Insufficient documentation

## 2018-03-30 MED ORDER — AZITHROMYCIN 250 MG PO TABS: 250.0000 mg | ORAL_TABLET | Freq: Every day | ORAL | 0 refills | Status: DC

## 2018-03-30 MED ORDER — HYDROCODONE-HOMATROPINE 5-1.5 MG/5ML PO SYRP: 5.0000 mL | ORAL_SOLUTION | Freq: Four times a day (QID) | ORAL | 0 refills | Status: DC | PRN

## 2018-03-30 NOTE — ED Triage Notes (Addendum)
PT reports cough at night for 1.5 weeks. PT reports cough was productive, but now it isn't.  BP is elevated in triage.

## 2018-03-30 NOTE — Discharge Instructions (Signed)
Please begin azithromycin - 2 tablets today, 1 tablet for following 4 days Use hycodan cough syrup at night time, this will cause drowsiness, do not use when you need to drive or work Robitussin or Delsym during the day  Follow up if not resolving or worsening

## 2018-03-31 NOTE — ED Provider Notes (Signed)
[facility]    CSN: [id number] Arrival date & time: 03/30/18  1456     History   Chief Complaint Chief Complaint  Patient presents with  . Cough    HPI Travis Ryan is a 43 y.o. male no contributing past medical history presenting today for evaluation of cough.  Patient states that he has had a cough for 2 weeks.  He initially had sore throat and nasal congestion, but this has resolved.  His main complaint is that his cough is been persistent without improvement.  He has tried over-the-counter medicines including NyQuil.  He has had some mild lower rib cage back discomfort on both sides and occasional chest pressure.  He has a history of seasonal asthma, but denies wheezing or shortness of breath.  Denies fevers.  Having difficulty sleeping at nighttime due to cough.  HPI  History reviewed. No pertinent past medical history.  Patient Active Problem List   Diagnosis Date Noted  . Chronic pain of left knee 03/21/2014  . Prepatellar bursitis of left knee 02/09/2014    Past Surgical History:  Procedure Laterality Date  . HERNIA REPAIR         Home Medications    Prior to Admission medications   Medication Sig Start Date End Date Taking? Authorizing Provider  acetaminophen (TYLENOL) 500 MG tablet Take 2 tablets (1,000 mg total) by mouth every 6 (six) hours as needed. 08/13/17   ,  C, PA-C  azithromycin (ZITHROMAX) 250 MG tablet Take 1 tablet (250 mg total) by mouth daily. Take first 2 tablets together, then 1 every day until finished. 03/30/18   ,  C, PA-C  cyclobenzaprine (FLEXERIL) 10 MG tablet Take 1 tablet (10 mg total) by mouth 3 (three) times daily as needed for muscle spasms. 06/20/15   Emi Belfast, FNP  fexofenadine (ALLEGRA) 180 MG tablet Take 180 mg by mouth daily.    [provider]  glucosamine-chondroitin 500-400 MG tablet Take 1 tablet by mouth 3 (three) times daily. Reported on 06/20/2015    [provider]  HYDROcodone-homatropine (HYCODAN) 5-1.5 MG/5ML syrup Take 5 mLs by mouth every 6 (six) hours as needed for cough. 03/30/18   ,  C, PA-C  meloxicam (MOBIC) 15 MG tablet Take 1 tablet (15 mg total) by mouth daily. 06/20/15   Emi Belfast, FNP    Family History Family History  Problem Relation Age of Onset  . Cancer Maternal Grandmother   . Early death Maternal Grandfather   . Cancer Paternal Grandmother   . Early death Paternal Grandfather     Social History Social History   Tobacco Use  . Smoking status: Former Smoker    Start date: 12/24/1999  . Smokeless tobacco: Never Used  Substance Use Topics  . Alcohol use: Yes    Alcohol/week: 12.0 standard drinks    Types: 12 Cans of beer per week    Comment: socal  . Drug use: No     Allergies   Patient has no known allergies.   Review of Systems Review of Systems  Constitutional: Negative for activity change, appetite change, chills, fatigue and fever.  HENT: Negative for congestion, ear pain, rhinorrhea, sinus pressure, sore throat and trouble swallowing.   Eyes: Negative for discharge and redness.  Respiratory: Positive for cough. Negative for chest tightness and shortness of breath.   Cardiovascular: Negative for chest pain.  Gastrointestinal: Negative for abdominal pain, diarrhea, nausea and vomiting.  Musculoskeletal: Positive for myalgias.  Skin:  Negative for rash.  Neurological: Negative for dizziness, light-headedness and headaches.     Physical Exam Triage Vital Signs ED Triage Vitals  Enc Vitals Group     BP 03/30/18 1530 (!) 153/105     Pulse Rate 03/30/18 1530 100     Resp 03/30/18 1530 18     Temp 03/30/18 1530 98.7 F (37.1 C)     Temp Source 03/30/18 1530 Oral     SpO2 03/30/18 1530 98 %     Weight --      Height --      Head Circumference --      Peak Flow --      Pain Score 03/30/18 1529 0     Pain Loc --      Pain Edu? --      Excl. in GC? --    No data found.  Updated  Vital Signs BP (!) 145/95   Pulse 100   Temp 98.7 F (37.1 C) (Oral)   Resp 18   SpO2 98%   Visual Acuity Right Eye Distance:   Left Eye Distance:   Bilateral Distance:    Right Eye Near:   Left Eye Near:    Bilateral Near:     Physical Exam Vitals signs and nursing note reviewed.  Constitutional:      Appearance: He is well-developed.     Comments: Nontoxic appearing, no acute distress  HENT:     Head: Normocephalic and atraumatic.     Ears:     Comments: Bilateral ears without tenderness to palpation of external auricle, tragus and mastoid, EAC's without erythema or swelling, TM's with good bony landmarks and cone of light. Non erythematous.    Mouth/Throat:     Comments: Oral mucosa pink and moist, no tonsillar enlargement or exudate. Posterior pharynx patent and nonerythematous, no uvula deviation or swelling. Normal phonation. Eyes:     Conjunctiva/sclera: Conjunctivae normal.  Neck:     Musculoskeletal: Neck supple.  Cardiovascular:     Rate and Rhythm: Normal rate and regular rhythm.     Heart sounds: No murmur.  Pulmonary:     Effort: Pulmonary effort is normal. No respiratory distress.     Breath sounds: Normal breath sounds.     Comments: Breathing comfortably at rest, CTABL, no wheezing, rales or other adventitious sounds auscultated Abdominal:     Palpations: Abdomen is soft.     Tenderness: There is no abdominal tenderness.  Skin:    General: Skin is warm and dry.  Neurological:     Mental Status: He is alert.      UC Treatments / Results  Labs (all labs ordered are listed, but only abnormal results are displayed) Labs Reviewed - No data to display  EKG None  Radiology No results found.  Procedures Procedures (including critical care time)  Medications Ordered in UC Medications - No data to display  Initial Impression / Assessment and Plan / UC Course  I have reviewed the triage vital signs and the nursing notes.  Pertinent labs &  imaging results that were available during my care of the patient were reviewed by me and considered in my medical decision making (see chart for details).     Patient with cough for 2 weeks, vital signs stable, exam unremarkable.  Will provide azithromycin to cover atypical respiratory illness.  Will provide Hycodan to use at nighttime.  Discussed drowsiness regarding this, advised Robitussin or Delsym over-the-counter for cough during the day.  Continue to monitor for continued gradual improvement of the cough.Discussed strict return precautions. Patient verbalized understanding and is agreeable with plan.  Final Clinical Impressions(s) / UC Diagnoses   Final diagnoses:  Cough     Discharge Instructions     Please begin azithromycin - 2 tablets today, 1 tablet for following 4 days Use hycodan cough syrup at night time, this will cause drowsiness, do not use when you need to drive or work Robitussin or Delsym during the day  Follow up if not resolving or worsening   ED Prescriptions    Medication Sig Dispense Auth. Provider   azithromycin (ZITHROMAX) 250 MG tablet Take 1 tablet (250 mg total) by mouth daily. Take first 2 tablets together, then 1 every day until finished. 6 tablet ,  C, PA-C   HYDROcodone-homatropine (HYCODAN) 5-1.5 MG/5ML syrup Take 5 mLs by mouth every 6 (six) hours as needed for cough. 70 mL ,  C, PA-C     Controlled Substance Prescriptions [place] Controlled Substance Registry consulted? Yes, I have consulted the [place] Controlled Substances Registry for this patient, and feel the risk/benefit ratio today is favorable for proceeding with this prescription for a controlled substance.   , [place] C, PA-C 03/31/18 1010

## 2020-07-28 ENCOUNTER — Inpatient Hospital Stay (HOSPITAL_COMMUNITY): Admission: RE | Admit: 2020-07-28 | Payer: Self-pay | Source: Ambulatory Visit

## 2020-07-28 ENCOUNTER — Ambulatory Visit (HOSPITAL_COMMUNITY)
Admission: EM | Admit: 2020-07-28 | Discharge: 2020-07-28 | Disposition: A | Discharge status: Home / Self Care | Payer: Self-pay | Attending: Internal Medicine | Admitting: Internal Medicine

## 2020-07-28 ENCOUNTER — Other Ambulatory Visit: Payer: Self-pay

## 2020-07-28 ENCOUNTER — Encounter (HOSPITAL_COMMUNITY): Payer: Self-pay | Admitting: *Deleted

## 2020-07-28 DIAGNOSIS — R030 Elevated blood-pressure reading, without diagnosis of hypertension: Secondary | ICD-10-CM | POA: Insufficient documentation

## 2020-07-28 DIAGNOSIS — Z8719 Personal history of other diseases of the digestive system: Secondary | ICD-10-CM | POA: Insufficient documentation

## 2020-07-28 DIAGNOSIS — R319 Hematuria, unspecified: Secondary | ICD-10-CM | POA: Insufficient documentation

## 2020-07-28 DIAGNOSIS — R1032 Left lower quadrant pain: Secondary | ICD-10-CM | POA: Insufficient documentation

## 2020-07-28 DIAGNOSIS — Z9889 Other specified postprocedural states: Secondary | ICD-10-CM | POA: Insufficient documentation

## 2020-07-28 LAB — POCT URINALYSIS DIPSTICK, ED / UC
Bilirubin Urine: NEGATIVE
Glucose, UA: 100 mg/dL — AB
Ketones, ur: NEGATIVE mg/dL
Leukocytes,Ua: NEGATIVE
Nitrite: NEGATIVE
Protein, ur: NEGATIVE mg/dL
Specific Gravity, Urine: >=1.03 (ref 1.005–1.030)
Urobilinogen, UA: 0.2 mg/dL (ref 0.0–1.0)
pH: 6 (ref 5.0–8.0)

## 2020-07-28 MED ORDER — TAMSULOSIN HCL 0.4 MG PO CAPS: 0.4000 mg | ORAL_CAPSULE | Freq: Every day | ORAL | 0 refills | Status: DC

## 2020-07-28 NOTE — ED Notes (Signed)
This RN acted as Information systems manager by provider.

## 2020-07-28 NOTE — ED Triage Notes (Signed)
Pt reports blood in urine on Thursday.

## 2020-07-28 NOTE — Discharge Instructions (Addendum)
I believe your pain is related to your previous hernia surgery but we will provide Flomax to help you pass the kidney stone in case this was the cause of your symptoms.  Ultimately, we need a CT scan of your stomach to look into this but we can not arrange that in urgent care.  Please follow-up with PCP as we discussed for additional evaluation and imaging.  Your blood pressure was very elevated today.  Please monitor this at home and if this remains above 140/90 follow-up with our clinic.  If you develop any headache, chest pain, shortness of breath, leg swelling, vision changes you need to go to the emergency room.

## 2020-07-28 NOTE — ED Provider Notes (Signed)
[facility]    CSN: [id number] Arrival date & time: 07/28/20  1105      History   Chief Complaint Chief Complaint  Patient presents with  . Hematuria    HPI Travis Ryan is a 45 y.o. male.   Patient presents today with a several day history of intermittent left inguinal pain and hematuria.  He has a history of inguinal hernia repaired several years ago and has had intermittent pain since that time.  He reports lifting something heavy and noticed hematuria.  This has since improved and he is no longer experiencing any symptoms other than occasional discomfort in left inguinal area.  Pain is rated 5 on a 0-10 pain scale, described as a discomfort, localized to left inguinal area at surgical scar, no aggravating or alleviating factors identified.  He has not tried any medication for symptom management.  He does report having intermittent pain since surgical procedure.  Denies any abdominal pain, irreducible bulge, swelling, changes in bowel habits, nausea, vomiting, severe pain.  Denies any urinary symptoms or concern for STI.  Declined STI testing today.  Denies history of nephrolithiasis.  Reports symptoms centimeter occurred by heavy lifting.  No additional complaints or concerns today.     History reviewed. No pertinent past medical history.  Patient Active Problem List   Diagnosis Date Noted  . Chronic pain of left knee 03/21/2014  . Prepatellar bursitis of left knee 02/09/2014    Past Surgical History:  Procedure Laterality Date  . HERNIA REPAIR         Home Medications    Prior to Admission medications   Medication Sig Start Date End Date Taking? Authorizing Provider  tamsulosin (FLOMAX) 0.4 MG CAPS capsule Take 1 capsule (0.4 mg total) by mouth daily. 07/28/20  Yes Khamari Yousuf, Noberto Retort, PA-C  acetaminophen (TYLENOL) 500 MG tablet Take 2 tablets (1,000 mg total) by mouth every 6 (six) hours as needed. 08/13/17   Wieters, Hallie C, PA-C  azithromycin (ZITHROMAX) 250  MG tablet Take 1 tablet (250 mg total) by mouth daily. Take first 2 tablets together, then 1 every day until finished. 03/30/18   Wieters, Hallie C, PA-C  fexofenadine (ALLEGRA) 180 MG tablet Take 180 mg by mouth daily.    [provider]  glucosamine-chondroitin 500-400 MG tablet Take 1 tablet by mouth 3 (three) times daily. Reported on 06/20/2015    [provider]    Family History Family History  Problem Relation Age of Onset  . Cancer Maternal Grandmother   . Early death Maternal Grandfather   . Cancer Paternal Grandmother   . Early death Paternal Grandfather     Social History Social History   Tobacco Use  . Smoking status: Former Smoker    Start date: 12/24/1999  . Smokeless tobacco: Never Used  Substance Use Topics  . Alcohol use: Yes    Alcohol/week: 12.0 standard drinks    Types: 12 Cans of beer per week    Comment: socal  . Drug use: No     Allergies   Patient has no known allergies.   Review of Systems Review of Systems  Constitutional: Positive for activity change. Negative for appetite change, fatigue and fever.  Respiratory: Negative for cough and shortness of breath.   Cardiovascular: Negative for chest pain.  Gastrointestinal: Negative for abdominal pain, blood in stool, diarrhea, nausea, rectal pain and vomiting.  Genitourinary: Positive for hematuria. Negative for enuresis, flank pain, frequency, penile swelling, scrotal swelling, testicular pain and  urgency.  Musculoskeletal: Negative for arthralgias and myalgias.  Neurological: Negative for dizziness, light-headedness and headaches.     Physical Exam Triage Vital Signs ED Triage Vitals  Enc Vitals Group     BP 07/28/20 1151 (!) 171/118     Pulse Rate 07/28/20 1151 79     Resp 07/28/20 1151 18     Temp 07/28/20 1151 97.7 F (36.5 C)     Temp src --      SpO2 07/28/20 1151 97 %     Weight --      Height --      Head Circumference --      Peak Flow --      Pain Score  07/28/20 1153 5     Pain Loc --      Pain Edu? --      Excl. in GC? --    No data found.  Updated Vital Signs BP (!) 171/118 (BP Location: Right Arm)   Pulse 79   Temp 97.7 F (36.5 C)   Resp 18   SpO2 97%   Visual Acuity Right Eye Distance:   Left Eye Distance:   Bilateral Distance:    Right Eye Near:   Left Eye Near:    Bilateral Near:     Physical Exam Vitals reviewed. Exam conducted with a chaperone present.  Constitutional:      General: He is awake.     Appearance: Normal appearance. He is normal weight. He is not ill-appearing.     Comments: Very pleasant male appears stated age in no acute distress  HENT:     Head: Normocephalic and atraumatic.  Cardiovascular:     Rate and Rhythm: Normal rate and regular rhythm.     Heart sounds: No murmur heard.   Pulmonary:     Effort: Pulmonary effort is normal.     Breath sounds: Normal breath sounds. No stridor. No wheezing, rhonchi or rales.     Comments: Clear to auscultation bilaterally Abdominal:     General: Bowel sounds are normal.     Palpations: Abdomen is soft.     Tenderness: There is no abdominal tenderness. There is no right CVA tenderness, left CVA tenderness, guarding or rebound.     Hernia: There is no hernia in the left inguinal area or right inguinal area.     Comments: Benign abdominal exam; no tenderness palpation.  No CVA tenderness.  Genitourinary:    Penis: Normal and uncircumcised.      Testes:        Right: Mass, tenderness or swelling not present.        Left: Mass, tenderness or swelling not present.     Comments: Inetta Fermo RN present as chaperone during exam Neurological:     Mental Status: He is alert.  Psychiatric:        Behavior: Behavior is cooperative.      UC Treatments / Results  Labs (all labs ordered are listed, but only abnormal results are displayed) Labs Reviewed  POCT URINALYSIS DIPSTICK, ED / UC - Abnormal; Notable for the following components:      Result Value    Glucose, UA 100 (*)    Hgb urine dipstick TRACE (*)    All other components within normal limits  URINE CULTURE    EKG   Radiology No results found.  Procedures Procedures (including critical care time)  Medications Ordered in UC Medications - No data to display  Initial Impression / Assessment and  Plan / UC Course  I have reviewed the triage vital signs and the nursing notes.  Pertinent labs & imaging results that were available during my care of the patient were reviewed by me and considered in my medical decision making (see chart for details).     UA showed trace hemoglobin but was otherwise normal.  Will obtain urine culture to rule out possible infection but defer antibiotic treatment for the time being.  Discussed with utility of STI testing the patient declined this today.  Low suspicion for nephrolithiasis but will treat with Flomax to help with expulsion in case this is the cause of symptoms.  Discussed that ultimately we need a CT scan of abdomen for further evaluation.  His vital signs and physical exam are reassuring today so no indication for emergent evaluation or imaging but discussed that he should establish with PCP to arrange additional evaluation.  He does not have a PCP so will help find PCP via PCP assistance.  Strict return precautions given to which patient expressed understanding.  Final Clinical Impressions(s) / UC Diagnoses   Final diagnoses:  Left inguinal pain  History of hernia repair  Hematuria, unspecified type  Elevated blood pressure reading     Discharge Instructions     I believe your pain is related to your previous hernia surgery but we will provide Flomax to help you pass the kidney stone in case this was the cause of your symptoms.  Ultimately, we need a CT scan of your stomach to look into this but we can not arrange that in urgent care.  Please follow-up with PCP as we discussed for additional evaluation and imaging.  Your blood  pressure was very elevated today.  Please monitor this at home and if this remains above 140/90 follow-up with our clinic.  If you develop any headache, chest pain, shortness of breath, leg swelling, vision changes you need to go to the emergency room.    ED Prescriptions    Medication Sig Dispense Auth. Provider   tamsulosin (FLOMAX) 0.4 MG CAPS capsule Take 1 capsule (0.4 mg total) by mouth daily. 10 capsule Paxten Appelt, Noberto Retort, PA-C     PDMP not reviewed this encounter.   Jeani Hawking, PA-C 07/28/20 1240

## 2020-07-29 LAB — URINE CULTURE: Culture: NO GROWTH

## 2020-08-30 ENCOUNTER — Other Ambulatory Visit: Payer: Self-pay

## 2020-08-30 ENCOUNTER — Ambulatory Visit (INDEPENDENT_AMBULATORY_CARE_PROVIDER_SITE_OTHER): Payer: Self-pay | Admitting: Emergency Medicine

## 2020-08-30 ENCOUNTER — Encounter: Payer: Self-pay | Admitting: Emergency Medicine

## 2020-08-30 VITALS — BP 114/90 | HR 91 | Temp 98.6°F | Ht 69.0 in | Wt 225.2 lb

## 2020-08-30 DIAGNOSIS — R1032 Left lower quadrant pain: Secondary | ICD-10-CM | POA: Insufficient documentation

## 2020-08-30 DIAGNOSIS — Z7689 Persons encountering health services in other specified circumstances: Secondary | ICD-10-CM

## 2020-08-30 NOTE — Assessment & Plan Note (Signed)
Asymptomatic at present time.  No abnormal physical findings. Prior episode may be related to passage of small kidney stone.  May be related to prior left inguinal hernia surgery.  May be inguinal strain.  No need for additional testing at present time.  Advised to contact the office if clinical picture changes or gets worse over the next several weeks.

## 2020-08-30 NOTE — Patient Instructions (Signed)
Mantenimiento de la salud en los hombres Health Maintenance, Male Adoptar un estilo de vida saludable y recibir atencin preventiva son importantes para promover la salud y el bienestar. Consulte al mdico sobre:  El esquema adecuado para hacerse pruebas y exmenes peridicos.  Cosas que puede hacer por su cuenta para prevenir enfermedades y mantenerse sano. Qu debo saber sobre la dieta, el peso y el ejercicio? Consuma una dieta saludable  Consuma una dieta que incluya muchas verduras, frutas, productos lcteos con bajo contenido de grasa y protenas magras.  No consuma muchos alimentos ricos en grasas slidas, azcares agregados o sodio.   Mantenga un peso saludable El ndice de masa muscular (IMC) es una medida que puede utilizarse para identificar posibles problemas de peso. Proporciona una estimacin de la grasa corporal basndose en el peso y la altura. Su mdico puede ayudarle a determinar su IMC y a lograr o mantener un peso saludable. Haga ejercicio con regularidad Haga ejercicio con regularidad. Esta es una de las prcticas ms importantes que puede hacer por su salud. La mayora de los adultos deben seguir estas pautas:  Realizar, al menos, 150minutos de actividad fsica por semana. El ejercicio debe aumentar la frecuencia cardaca y hacerlo transpirar (ejercicio de intensidad moderada).  Hacer ejercicios de fortalecimiento por lo menos dos veces por semana. Agregue esto a su plan de ejercicio de intensidad moderada.  Pasar menos tiempo sentados. Incluso la actividad fsica ligera puede ser beneficiosa. Controle sus niveles de colesterol y lpidos en la sangre Comience a realizarse anlisis de lpidos y colesterol en la sangre a los 20aos y luego reptalos cada 5aos. Es posible que necesite controlar los niveles de colesterol con mayor frecuencia si:  Sus niveles de lpidos y colesterol son altos.  Es mayor de 40aos.  Presenta un alto riesgo de padecer enfermedades  cardacas. Qu debo saber sobre las pruebas de deteccin del cncer? Muchos tipos de cncer pueden detectarse de manera temprana y, a menudo, pueden prevenirse. Segn su historia clnica y sus antecedentes familiares, es posible que deba realizarse pruebas de deteccin del cncer en diferentes edades. Esto puede incluir pruebas de deteccin de lo siguiente:  Cncer colorrectal.  Cncer de prstata.  Cncer de piel.  Cncer de pulmn. Qu debo saber sobre la enfermedad cardaca, la diabetes y la hipertensin arterial? Presin arterial y enfermedad cardaca  La hipertensin arterial causa enfermedades cardacas y aumenta el riesgo de accidente cerebrovascular. Es ms probable que esto se manifieste en las personas que tienen lecturas de presin arterial alta, tienen ascendencia africana o tienen sobrepeso.  Hable con el mdico sobre sus valores de presin arterial deseados.  Hgase controlar la presin arterial: ? Cada 3 a 5 aos si tiene entre 18 y 39 aos. ? Todos los aos si es mayor de 40aos.  Si tiene entre 65 y 75 aos y es fumador o sola fumar, pregntele al mdico si debe realizarse una prueba de deteccin de aneurisma artico abdominal (AAA) por nica vez. Diabetes Realcese exmenes de deteccin de la diabetes con regularidad. Este anlisis revisa el nivel de azcar en la sangre en ayunas. Hgase las pruebas de deteccin:  Cada tresaos despus de los 45aos de edad si tiene un peso normal y un bajo riesgo de padecer diabetes.  Con ms frecuencia y a partir de una edad inferior si tiene sobrepeso o un alto riesgo de padecer diabetes. Qu debo saber sobre la prevencin de infecciones? Hepatitis B Si tiene un riesgo ms alto de contraer hepatitis B, debe   someterse a un examen de deteccin de este virus. Hable con el mdico para averiguar si tiene riesgo de contraer la infeccin por hepatitis B. Hepatitis C Se recomienda un anlisis de sangre para:  Todos los que  nacieron entre 1945 y 1965.  Todas las personas que tengan un riesgo de haber contrado hepatitis C. Enfermedades de transmisin sexual (ETS)  Debe realizarse pruebas de deteccin de ITS todos los aos, incluidas la gonorrea y la clamidia, si: ? Es sexualmente activo y es menor de 24aos. ? Es mayor de 24aos, y el mdico le informa que corre riesgo de tener este tipo de infecciones. ? La actividad sexual ha cambiado desde que le hicieron la ltima prueba de deteccin y tiene un riesgo mayor de tener clamidia o gonorrea. Pregntele al mdico si usted tiene riesgo.  Pregntele al mdico si usted tiene un alto riesgo de contraer VIH. El mdico tambin puede recomendarle un medicamento recetado para ayudar a evitar la infeccin por el VIH. Si elige tomar medicamentos para prevenir el VIH, primero debe hacerse los anlisis de deteccin del VIH. Luego debe hacerse anlisis cada 3meses mientras est tomando los medicamentos. Siga estas instrucciones en su casa: Estilo de vida  No consuma ningn producto que contenga nicotina o tabaco, como cigarrillos, cigarrillos electrnicos y tabaco de mascar. Si necesita ayuda para dejar de fumar, consulte al mdico.  No consuma drogas.  No comparta agujas.  Solicite ayuda a su mdico si necesita apoyo o informacin para abandonar las drogas. Consumo de alcohol  No beba alcohol si el mdico se lo prohbe.  Si bebe alcohol: ? Limite la cantidad que consume de 0 a 2 medidas por da. ? Est atento a la cantidad de alcohol que hay en las bebidas que toma. En [place], una medida equivale a una botella de cerveza de 12oz (355ml), un vaso de vino de 5oz (148ml) o un vaso de una bebida alcohlica de alta graduacin de 1oz (44ml). Instrucciones generales  Realcese los estudios de rutina de la salud, dentales y de la vista.  Mantngase al da con las vacunas.  Infrmele a su mdico si: ? Se siente deprimido con frecuencia. ? Alguna vez  ha sido vctima de maltrato o no se siente seguro en su casa. Resumen  Adoptar un estilo de vida saludable y recibir atencin preventiva son importantes para promover la salud y el bienestar.  Siga las instrucciones del mdico acerca de una dieta saludable, el ejercicio y la realizacin de pruebas o exmenes para detectar enfermedades.  Siga las instrucciones del mdico con respecto al control del colesterol y la presin arterial. Esta informacin no tiene como fin reemplazar el consejo del mdico. Asegrese de hacerle al mdico cualquier pregunta que tenga. Document Revised: 03/31/2018 Document Reviewed: 03/31/2018 Elsevier Patient Education  2021 [employer]  

## 2020-08-30 NOTE — Progress Notes (Signed)
Travis Ryan 45 y.o.   Chief Complaint  Patient presents with   New Patient (Initial Visit)    HISTORY OF PRESENT ILLNESS: This is a 45 y.o. male first visit to this office here to establish care with me. Also wants to discuss episode of left inguinal pain last month.  Was seen at the urgent care center and told may be with a kidney stone. Patient has history of left inguinal hernia repair about 4 years ago. Urine showed small amount of blood then.  Urine culture was negative.  Much better today.  Asymptomatic.  Denies any urinary symptoms.  No history of kidney stones.  HPI   Prior to Admission medications   Medication Sig Start Date End Date Taking? Authorizing Provider  acetaminophen (TYLENOL) 500 MG tablet Take 2 tablets (1,000 mg total) by mouth every 6 (six) hours as needed. 08/13/17  Yes Wieters, Hallie C, PA-C  fexofenadine (ALLEGRA) 180 MG tablet Take 180 mg by mouth daily.   Yes [provider]  azithromycin (ZITHROMAX) 250 MG tablet Take 1 tablet (250 mg total) by mouth daily. Take first 2 tablets together, then 1 every day until finished. Patient not taking: Reported on 08/30/2020 03/30/18   Wieters, Junius Creamer, PA-C  glucosamine-chondroitin 500-400 MG tablet Take 1 tablet by mouth 3 (three) times daily. Reported on 06/20/2015 Patient not taking: Reported on 08/30/2020    [provider]  tamsulosin (FLOMAX) 0.4 MG CAPS capsule Take 1 capsule (0.4 mg total) by mouth daily. Patient not taking: Reported on 08/30/2020 07/28/20   Raspet, Noberto Retort, PA-C    No Known Allergies  Patient Active Problem List   Diagnosis Date Noted   Chronic pain of left knee 03/21/2014   Prepatellar bursitis of left knee 02/09/2014    History reviewed. No pertinent past medical history.  Past Surgical History:  Procedure Laterality Date   HERNIA REPAIR      Social History   Socioeconomic History   Marital status: Single    Spouse name: Not on file   Number of children: Not on  file   Years of education: Not on file   Highest education level: Not on file  Occupational History   Not on file  Tobacco Use   Smoking status: Former    Pack years: 0.00    Types: Cigarettes    Start date: 12/24/1999   Smokeless tobacco: Never  Substance and Sexual Activity   Alcohol use: Yes    Alcohol/week: 12.0 standard drinks    Types: 12 Cans of beer per week    Comment: socal   Drug use: No   Sexual activity: Not on file  Other Topics Concern   Not on file  Social History Narrative   Not on file   Social Determinants of Health   Financial Resource Strain: Not on file  Food Insecurity: Not on file  Transportation Needs: Not on file  Physical Activity: Not on file  Stress: Not on file  Social Connections: Not on file  Intimate Partner Violence: Not on file    Family History  Problem Relation Age of Onset   Cancer Maternal Grandmother    Early death Maternal Grandfather    Cancer Paternal Grandmother    Early death Paternal Grandfather      Review of Systems  Constitutional: Negative.  Negative for chills and fever.  HENT: Negative.  Negative for congestion and sore throat.   Respiratory: Negative.  Negative for cough and shortness of breath.  Cardiovascular: Negative.  Negative for chest pain and palpitations.  Gastrointestinal:  Negative for abdominal pain, diarrhea, nausea and vomiting.  Genitourinary: Negative.  Negative for dysuria.  Musculoskeletal: Negative.  Negative for myalgias and neck pain.  Skin: Negative.  Negative for rash.  Neurological:  Negative for dizziness and headaches.  All other systems reviewed and are negative.   Physical Exam Vitals reviewed.  Constitutional:      Appearance: Normal appearance.  HENT:     Head: Normocephalic.  Eyes:     Extraocular Movements: Extraocular movements intact.     Pupils: Pupils are equal, round, and reactive to light.  Cardiovascular:     Rate and Rhythm: Normal rate and regular rhythm.      Pulses: Normal pulses.     Heart sounds: Normal heart sounds.  Pulmonary:     Effort: Pulmonary effort is normal.     Breath sounds: Normal breath sounds.  Abdominal:     General: Bowel sounds are normal. There is no distension.     Palpations: Abdomen is soft.     Tenderness: There is no abdominal tenderness.     Hernia: There is no hernia in the left inguinal area or right inguinal area.  Genitourinary:    Penis: Normal and uncircumcised.      Testes: Normal.        Right: Mass or tenderness not present.        Left: Mass or tenderness not present.     Epididymis:     Right: Normal.     Left: Normal.  Musculoskeletal:        General: Normal range of motion.     Cervical back: Normal range of motion.  Lymphadenopathy:     Lower Body: No right inguinal adenopathy. No left inguinal adenopathy.  Skin:    General: Skin is warm and dry.  Neurological:     General: No focal deficit present.     Mental Status: He is alert and oriented to person, place, and time.  Psychiatric:        Mood and Affect: Mood normal.        Behavior: Behavior normal.     ASSESSMENT & PLAN: Left inguinal pain Asymptomatic at present time.  No abnormal physical findings. Prior episode may be related to passage of small kidney stone.  May be related to prior left inguinal hernia surgery.  May be inguinal strain.  No need for additional testing at present time.  Advised to contact the office if clinical picture changes or gets worse over the next several weeks. Travis Ryan was seen today for new patient (initial visit).  Diagnoses and all orders for this visit:  Left inguinal pain Comments: Much improved  Encounter to establish care  Patient Instructions  Mantenimiento de la salud en los hombres Health Maintenance, Male Adoptar un estilo de vida saludable y recibir atencin preventiva son importantes para promover la salud y Counsellorel bienestar. Consulte al mdico sobre: El esquema adecuado para hacerse  pruebas y exmenes peridicos. Cosas que puede hacer por su cuenta para prevenir enfermedades y [place]enerse sano. Qu debo saber sobre la dieta, el peso y el ejercicio? Consuma una dieta saludable Consuma una dieta que incluya muchas verduras, frutas, productos lcteos con bajo contenido de [place]grasa y Associate Professorprotenas magras. No consuma muchos alimentos ricos en grasas slidas, azcares agregados o sodio.   Mantenga un peso saludable El ndice de masa muscular [facility]) es una medida que puede utilizarse para identificar posibles problemas de  peso. Proporciona una estimacin de la grasa corporal basndose en el peso y la altura. Su mdico puede ayudarle a Engineer, site [facility] y a Personnel officer o Pharmacologist un peso saludable. Haga ejercicio con regularidad Haga ejercicio con regularidad. Esta es una de las prcticas ms importantes que puede hacer por su salud. La [employer] de los adultos deben seguir estas pautas: Education officer, environmental, al menos, 150 minutos de actividad fsica por semana. El ejercicio debe aumentar la frecuencia cardaca y Media planner transpirar (ejercicio de intensidad moderada). Hacer ejercicios de fortalecimiento por lo [employer] por semana. Agregue esto a su plan de ejercicio de intensidad moderada. Pasar menos tiempo sentados. Incluso la actividad fsica ligera puede ser beneficiosa. Controle sus niveles de colesterol y lpidos en la sangre Comience a realizarse anlisis de lpidos y Oncologist en la sangre a los 20 aos y luego reptalos cada 5 aos. Es posible que Insurance underwriter los niveles de colesterol con mayor frecuencia si: Sus niveles de lpidos y colesterol son altos. Es mayor de 40 aos. Presenta un alto riesgo de padecer enfermedades cardacas. Qu debo saber sobre las pruebas de deteccin del cncer? Muchos tipos de cncer pueden detectarse de manera temprana y, a menudo, pueden prevenirse. Segn su historia clnica y sus antecedentes familiares, es posible que deba realizarse pruebas de  deteccin del cncer en diferentes edades. Esto puede incluir pruebas de deteccin de lo siguiente: Building services engineer. Cncer de prstata. Cncer de piel. Cncer de pulmn. Qu debo saber sobre la enfermedad cardaca, la diabetes y la hipertensin arterial? Presin arterial y enfermedad cardaca La hipertensin arterial causa enfermedades cardacas y [place] el riesgo de accidente cerebrovascular. Es ms probable que esto se manifieste en las personas que tienen lecturas de presin arterial alta, tienen ascendencia africana o tienen sobrepeso. Hable con el mdico sobre sus valores de presin arterial deseados. Hgase controlar la presin arterial: Cada 3 a 5 aos si tiene entre 18 y 74 aos. Todos los aos si es mayor de 40 aos. Si tiene entre 65 y 33 aos y es fumador o Insurance underwriter, pregntele al mdico si debe realizarse una prueba de deteccin de aneurisma artico abdominal (AAA) por nica vez. Diabetes Realcese exmenes de deteccin de la diabetes con regularidad. Este anlisis revisa el nivel de azcar en la sangre en [place]. Hgase las pruebas de deteccin: Cada tres aos despus de los 45 aos de edad si tiene un peso normal y un bajo riesgo de padecer diabetes. Con ms frecuencia y a partir de [place] edad inferior si tiene sobrepeso o un alto riesgo de padecer diabetes. Qu debo saber sobre la prevencin de infecciones? Hepatitis B Si tiene un riesgo ms alto de contraer hepatitis B, debe someterse a un examen de deteccin de este virus. Hable con el mdico para averiguar si tiene riesgo de contraer la infeccin por hepatitis B. Hepatitis C Se recomienda un anlisis de [place] para: Todos los que nacieron entre 1943 y [phone]. Todas las personas que tengan un riesgo de haber contrado hepatitis C. Enfermedades de transmisin sexual (ETS) Debe realizarse pruebas de deteccin de ITS todos los aos, incluidas la gonorrea y la clamidia, si: Es sexualmente activo y es menor de [address]. Es  mayor de [address], y Public affairs consultant informa que corre riesgo de tener este tipo de infecciones. La actividad sexual ha cambiado desde que le hicieron la ltima prueba de deteccin y tiene un riesgo mayor de Warehouse manager clamidia o Copy. Pregntele al mdico si usted tiene riesgo. Pregntele al  mdico si usted tiene un alto riesgo de Primary school teacher VIH. El mdico tambin puede recomendarle un medicamento recetado para ayudar a evitar la infeccin por el VIH. Si elige tomar medicamentos para prevenir el VIH, primero debe [employer] de deteccin del VIH. Luego debe hacerse anlisis cada 3 meses mientras est tomando los medicamentos. Siga estas instrucciones en su casa: Estilo de vida No consuma ningn producto que contenga nicotina o tabaco, como cigarrillos, cigarrillos electrnicos y tabaco de Theatre manager. Si necesita ayuda para dejar de fumar, consulte al mdico. No consuma drogas. No comparta agujas. Solicite ayuda a su mdico si necesita apoyo o informacin para abandonar las drogas. Consumo de alcohol No beba alcohol si el mdico se lo prohbe. Si bebe alcohol: Limite la cantidad que consume de 0 a 2 medidas por da. Est atento a la cantidad de alcohol que hay en las bebidas que toma. En los [address], una medida equivale a una botella de cerveza de 12 oz (355 ml), un vaso de vino de 5 oz (148 ml) o un vaso de una bebida alcohlica de alta graduacin de 1 oz (44 ml). Instrucciones generales Realcese los estudios de rutina de la salud, dentales y de Wellsite geologist. Mantngase al da con las vacunas. Infrmele a su mdico si: Se siente deprimido con frecuencia. Alguna vez ha sido vctima de [place] o no se siente seguro en su casa. Resumen Adoptar un estilo de vida saludable y recibir atencin preventiva son importantes para promover la salud y Counsellor. Siga las instrucciones del mdico acerca de una dieta saludable, el ejercicio y la realizacin de pruebas o exmenes para Radiation protection practitioner. Siga las instrucciones del mdico con respecto al control del colesterol y la presin arterial. Esta informacin no tiene Theme park manager el consejo del mdico. Asegrese de hacerle al mdico cualquier pregunta que tenga. Document Revised: 03/31/2018 Document Reviewed: 03/31/2018 Elsevier Patient Education  2021 Elsevier Inc.    Edwina Barth, MD [facility]

## 2021-02-12 ENCOUNTER — Ambulatory Visit (HOSPITAL_COMMUNITY)
Admission: EM | Admit: 2021-02-12 | Discharge: 2021-02-12 | Disposition: A | Discharge status: Home / Self Care | Payer: Self-pay | Attending: Urgent Care | Admitting: Urgent Care

## 2021-02-12 ENCOUNTER — Encounter: Payer: Self-pay | Admitting: Emergency Medicine

## 2021-02-12 ENCOUNTER — Encounter (HOSPITAL_COMMUNITY): Payer: Self-pay | Admitting: Emergency Medicine

## 2021-02-12 ENCOUNTER — Other Ambulatory Visit: Payer: Self-pay

## 2021-02-12 DIAGNOSIS — R070 Pain in throat: Secondary | ICD-10-CM

## 2021-02-12 DIAGNOSIS — J101 Influenza due to other identified influenza virus with other respiratory manifestations: Secondary | ICD-10-CM

## 2021-02-12 DIAGNOSIS — R0982 Postnasal drip: Secondary | ICD-10-CM

## 2021-02-12 LAB — POC INFLUENZA A AND B ANTIGEN (URGENT CARE ONLY)
INFLUENZA A ANTIGEN, POC: POSITIVE — AB
INFLUENZA B ANTIGEN, POC: NEGATIVE

## 2021-02-12 MED ORDER — BENZONATATE 100 MG PO CAPS: 100.0000 mg | ORAL_CAPSULE | Freq: Three times a day (TID) | ORAL | 0 refills | Status: DC | PRN

## 2021-02-12 MED ORDER — CHLORASEPTIC 1.4 % MT LIQD: 1.0000 | Freq: Three times a day (TID) | OROMUCOSAL | 0 refills | Status: DC | PRN

## 2021-02-12 MED ORDER — PROMETHAZINE-DM 6.25-15 MG/5ML PO SYRP: 5.0000 mL | ORAL_SOLUTION | Freq: Every evening | ORAL | 0 refills | Status: DC | PRN

## 2021-02-12 MED ORDER — PSEUDOEPHEDRINE HCL 60 MG PO TABS: 60.0000 mg | ORAL_TABLET | Freq: Three times a day (TID) | ORAL | 0 refills | Status: DC | PRN

## 2021-02-12 MED ORDER — CETIRIZINE HCL 10 MG PO TABS: 10.0000 mg | ORAL_TABLET | Freq: Every day | ORAL | 0 refills | Status: DC

## 2021-02-12 NOTE — ED Triage Notes (Signed)
Pt presents with cough and scratchy throat Xs 3 days. States OTC cough syrup gives some relief.

## 2021-02-12 NOTE — ED Provider Notes (Signed)
[facility]   MRN: [id number] DOB: [birth year]  Subjective:   Travis Ryan is a 45 y.o. male presenting for 3 day history of acute onset throat pain, cough, post-nasal drainage. Denies chest pain, shob, wheezing, n/v, abdominal pain. Has a history of allergies, is not on anything consistently for this.   He is not currently taking any medications and has no known food or drug allergies.  Denies past medical and surgical history.  History reviewed. No pertinent family history.  Social History   Tobacco Use   Smoking status: Never   Smokeless tobacco: Never  Substance Use Topics   Alcohol use: Not Currently   Drug use: Not Currently    ROS   Objective:   Vitals: BP (!) 140/95 (BP Location: Left Arm)   Pulse (!) 101   Temp 98.9 F (37.2 C) (Oral)   Resp 18   SpO2 96%   Physical Exam Constitutional:      General: He is not in acute distress.    Appearance: Normal appearance. He is well-developed and normal weight. He is not ill-appearing, toxic-appearing or diaphoretic.  HENT:     Head: Normocephalic and atraumatic.     Right Ear: Tympanic membrane, ear canal and external ear normal. There is no impacted cerumen.     Left Ear: Tympanic membrane, ear canal and external ear normal. There is no impacted cerumen.     Nose: Congestion and rhinorrhea present.     Mouth/Throat:     Mouth: Mucous membranes are moist.     Pharynx: No pharyngeal swelling, oropharyngeal exudate, posterior oropharyngeal erythema or uvula swelling.  Eyes:     General: No scleral icterus.       Right eye: No discharge.        Left eye: No discharge.     Extraocular Movements: Extraocular movements intact.     Conjunctiva/sclera: Conjunctivae normal.     Pupils: Pupils are equal, round, and reactive to light.  Cardiovascular:     Rate and Rhythm: Normal rate and regular rhythm.     Heart sounds: Normal heart sounds. No murmur heard.   No friction rub. No gallop.   Pulmonary:     Effort: Pulmonary effort is normal. No respiratory distress.     Breath sounds: Normal breath sounds. No stridor. No wheezing, rhonchi or rales.  Musculoskeletal:     Cervical back: Normal range of motion and neck supple. No rigidity. No muscular tenderness.  Neurological:     General: No focal deficit present.     Mental Status: He is alert and oriented to person, place, and time.  Psychiatric:        Mood and Affect: Mood normal.        Behavior: Behavior normal.        Thought Content: Thought content normal.    Results for orders placed or performed during the hospital encounter of 02/12/21 (from the past 24 hour(s))  POC Influenza A & B Ag (Urgent Care)     Status: Abnormal   Collection Time: 02/12/21  3:33 PM  Result Value Ref Range   INFLUENZA A ANTIGEN, POC POSITIVE (A) NEGATIVE   INFLUENZA B ANTIGEN, POC NEGATIVE NEGATIVE    Assessment and Plan :   PDMP not reviewed this encounter.  1. Influenza A   2. Throat pain   3. Post-nasal drainage    Deferred imaging given clear cardiopulmonary exam, hemodynamically stable vital signs. Will cover for influenza with  supportive care, rest, fluids, hydration, light meals, schedule Tylenol and ibuprofen. Counseled patient on potential for adverse effects with medications prescribed today, patient verbalized understanding. ER and return-to-clinic precautions discussed, patient verbalized understanding.    Wallis Bamberg, PA-C 02/12/21 1540

## 2022-01-08 ENCOUNTER — Encounter (HOSPITAL_COMMUNITY): Payer: Self-pay | Admitting: Emergency Medicine

## 2022-01-08 ENCOUNTER — Telehealth (HOSPITAL_COMMUNITY): Payer: Self-pay | Admitting: Emergency Medicine

## 2022-01-08 ENCOUNTER — Other Ambulatory Visit: Payer: Self-pay

## 2022-01-08 ENCOUNTER — Other Ambulatory Visit (HOSPITAL_COMMUNITY): Payer: Self-pay

## 2022-01-08 ENCOUNTER — Ambulatory Visit (HOSPITAL_COMMUNITY)
Admission: EM | Admit: 2022-01-08 | Discharge: 2022-01-08 | Disposition: A | Discharge status: Home / Self Care | Payer: Self-pay | Attending: Sports Medicine | Admitting: Sports Medicine

## 2022-01-08 DIAGNOSIS — K6289 Other specified diseases of anus and rectum: Secondary | ICD-10-CM

## 2022-01-08 MED ORDER — NIFEDIPINE 0.3 % OINTMENT: 1.0000 | TOPICAL_OINTMENT | Freq: Four times a day (QID) | CUTANEOUS | 0 refills | Status: DC

## 2022-01-08 MED ORDER — NIFEDIPINE 0.3 % OINTMENT
1.0000 | TOPICAL_OINTMENT | Freq: Four times a day (QID) | CUTANEOUS | 0 refills | Status: DC
  Filled 2022-01-08: qty 30, 8d supply, fill #0

## 2022-01-08 MED ORDER — NIFEDIPINE 0.3 % OINTMENT
1.0000 | TOPICAL_OINTMENT | Freq: Four times a day (QID) | CUTANEOUS | 0 refills | Status: DC
  Filled 2022-01-08: qty 30, fill #0

## 2022-01-08 NOTE — ED Provider Notes (Signed)
[facility]   [id number] 01/08/22 Arrival Time: 7829  ASSESSMENT & PLAN:  1. Rectal pain    -History and exam is most consistent with an anal fissure.  There is not an obvious fissure seen on the external sphincter of I suspect there is an internal one.  He has no signs of hemorrhoids.  He is nontoxic-appearing and has no risk factors in his history for proctitis or prostate pathology.  We will treat this conservatively with nifedipine cream applied up to 4 times daily.  ER precautions given should fever, blood in the stool, abdominal pain develop.  All questions answered he agrees to plan.  Meds ordered this encounter  Medications   nifedipine 0.3 % ointment    Sig: Place 1 Application rectally 4 (four) times daily.    Dispense:  30 g    Refill:  0     Discharge Instructions      You have an anal fissure        Reviewed expectations re: course of current medical issues. Questions answered. Outlined signs and symptoms indicating need for more acute intervention. Patient verbalized understanding. After Visit Summary given.   SUBJECTIVE: Pleasant 46 year old male comes urgent care to be evaluated for rectal pain.  He has had 2 days of rectal pain.  Started after he ate some spicy pepper foods.  The pain is very sharp and makes it uncomfortable for him to sit and he has pain with walking.  He also has pain with bowel movements.  He denies any blood in the bowel movements.  He denies any urinary symptoms, fevers or chills, or abdominal pain.  He is sexually active with 1 partner, his wife.  He denies any receptive anal sex.  No LMP for male patient. Past Surgical History:  Procedure Laterality Date   HERNIA REPAIR       OBJECTIVE:  Vitals:   01/08/22 0854  BP: (!) 135/96  Pulse: 96  Resp: 20  Temp: 98.3 F (36.8 C)  TempSrc: Oral  SpO2: 97%     Physical Exam Vitals reviewed.  Constitutional:      General: He is not in acute distress (mildly  uncomfortable sitting). Cardiovascular:     Rate and Rhythm: Normal rate.  Pulmonary:     Effort: Pulmonary effort is normal.  Abdominal:     Palpations: Abdomen is soft.     Tenderness: There is no abdominal tenderness.  Genitourinary:    Prostate: Normal.     Rectum: Tenderness present. No mass.     Comments: No evidence of hemorrhoids, no obvious external fissure or inflammation  Tender to palpation over the external sphincter   Painful digital rectal exam but no prostate bogginess appreciated     Labs: Results for orders placed or performed during the hospital encounter of 02/12/21  POC Influenza A & B Ag (Urgent Care)  Result Value Ref Range   INFLUENZA A ANTIGEN, POC POSITIVE (A) NEGATIVE   INFLUENZA B ANTIGEN, POC NEGATIVE NEGATIVE   Labs Reviewed - No data to display  Imaging: No results found.   No Known Allergies                                             History reviewed. No pertinent past medical history.  Social History   Socioeconomic History   Marital status: Single  Spouse name: Not on file   Number of children: Not on file   Years of education: Not on file   Highest education level: Not on file  Occupational History   Not on file  Tobacco Use   Smoking status: Former    Types: Cigarettes   Smokeless tobacco: Never  Vaping Use   Vaping Use: Never used  Substance and Sexual Activity   Alcohol use: Not Currently    Comment: socal   Drug use: Yes   Sexual activity: Not on file  Other Topics Concern   Not on file  Social History Narrative   ** Merged History Encounter **       Social Determinants of Health   Financial Resource Strain: Not on file  Food Insecurity: Not on file  Transportation Needs: Not on file  Physical Activity: Not on file  Stress: Not on file  Social Connections: Not on file  Intimate Partner Violence: Not on file    Family History  Problem Relation Age of Onset   Cancer Maternal Grandmother    Early death  Maternal Grandfather    Cancer Paternal Grandmother    Early death Paternal Grandfather       Aleria Maheu, Dorian Pod, MD 01/08/22 1035

## 2022-01-08 NOTE — ED Notes (Signed)
At bedside for provider exam, rectal exam

## 2022-01-08 NOTE — ED Triage Notes (Signed)
Complaining of abdominal pain and back pain.painful when having bowel movement.  Patient thinks related to eating spices and coworker suggested hemorrhoids.  Then patient complains of pain intermittently from low back/flank.    Has been taking ibuprofen.    Patient does have a pcp

## 2022-01-08 NOTE — Discharge Instructions (Signed)
You have an anal fissure

## 2022-01-10 ENCOUNTER — Telehealth (HOSPITAL_COMMUNITY): Payer: Self-pay | Admitting: Emergency Medicine

## 2022-01-10 ENCOUNTER — Other Ambulatory Visit (HOSPITAL_COMMUNITY): Payer: Self-pay

## 2022-01-10 MED ORDER — NIFEDIPINE 0.3 % OINTMENT: 1.0000 | TOPICAL_OINTMENT | Freq: Four times a day (QID) | CUTANEOUS | 0 refills | Status: DC

## 2022-01-10 NOTE — Telephone Encounter (Signed)
Prescription needed a local pharmacy that can do compounding, resent

## 2022-01-13 ENCOUNTER — Ambulatory Visit (INDEPENDENT_AMBULATORY_CARE_PROVIDER_SITE_OTHER): Payer: Self-pay | Admitting: Emergency Medicine

## 2022-01-13 ENCOUNTER — Encounter: Payer: Self-pay | Admitting: Emergency Medicine

## 2022-01-13 VITALS — BP 122/84 | HR 87 | Temp 98.2°F | Ht 69.0 in | Wt 225.2 lb

## 2022-01-13 DIAGNOSIS — K602 Anal fissure, unspecified: Secondary | ICD-10-CM | POA: Insufficient documentation

## 2022-01-13 DIAGNOSIS — K610 Anal abscess: Secondary | ICD-10-CM | POA: Insufficient documentation

## 2022-01-13 MED ORDER — AMOXICILLIN-POT CLAVULANATE 875-125 MG PO TABS: 1.0000 | ORAL_TABLET | Freq: Two times a day (BID) | ORAL | 0 refills | Status: DC

## 2022-01-13 NOTE — Progress Notes (Signed)
Travis Ryan 46 y.o.   Chief Complaint  Patient presents with   Rectal Pain    Patient went to urgent care last week, swelling     HISTORY OF PRESENT ILLNESS: This is a 46 y.o. male complaining of swelling and pain to rectal area that started about 1 week ago [name] to urgent care clinic, assessment and plan as follows: ASSESSMENT & PLAN:   1. Rectal pain     -History and exam is most consistent with an anal fissure.  There is not an obvious fissure seen on the external sphincter of I suspect there is an internal one.  He has no signs of hemorrhoids.  He is nontoxic-appearing and has no risk factors in his history for proctitis or prostate pathology.  We will treat this conservatively with nifedipine cream applied up to 4 times daily.  ER precautions given should fever, blood in the stool, abdominal pain develop.  All questions answered he agrees to plan.       Meds ordered this encounter  Medications   nifedipine 0.3 % ointment      Sig: Place 1 Application rectally 4 (four) times daily.      Dispense:  30 g      Refill:  0   Continues to have pain and symptoms.  HPI   Prior to Admission medications   Medication Sig Start Date End Date Taking? Authorizing Provider  acetaminophen (TYLENOL) 500 MG tablet Take 2 tablets (1,000 mg total) by mouth every 6 (six) hours as needed. 08/13/17  Yes Wieters, Hallie C, PA-C  cetirizine (ZYRTEC ALLERGY) 10 MG tablet Take 1 tablet (10 mg total) by mouth daily. 02/12/21  Yes Wallis Bamberg, PA-C  fexofenadine (ALLEGRA) 180 MG tablet Take 180 mg by mouth daily.   Yes [provider]  nifedipine 0.3 % ointment Place 1 Application rectally 4 (four) times daily. 01/10/22  Yes Lamptey, Britta Mccreedy, MD  phenol (CHLORASEPTIC) 1.4 % LIQD Use as directed 1 spray in the mouth or throat 3 (three) times daily as needed for throat irritation / pain. 02/12/21  Yes Wallis Bamberg, PA-C    No Known Allergies  There are no problems to display for this  patient.   No past medical history on file.  Past Surgical History:  Procedure Laterality Date   HERNIA REPAIR      Social History   Socioeconomic History   Marital status: Single    Spouse name: Not on file   Number of children: Not on file   Years of education: Not on file   Highest education level: Not on file  Occupational History   Not on file  Tobacco Use   Smoking status: Former    Types: Cigarettes   Smokeless tobacco: Never  Vaping Use   Vaping Use: Never used  Substance and Sexual Activity   Alcohol use: Not Currently    Comment: socal   Drug use: Yes   Sexual activity: Not on file  Other Topics Concern   Not on file  Social History Narrative   ** Merged History Encounter **       Social Determinants of Health   Financial Resource Strain: Not on file  Food Insecurity: Not on file  Transportation Needs: Not on file  Physical Activity: Not on file  Stress: Not on file  Social Connections: Not on file  Intimate Partner Violence: Not on file    Family History  Problem Relation Age of Onset   Cancer  Maternal Grandmother    Early death Maternal Grandfather    Cancer Paternal Grandmother    Early death Paternal Grandfather      Review of Systems  Constitutional: Negative.  Negative for chills and fever.  HENT: Negative.  Negative for congestion and sore throat.   Respiratory: Negative.  Negative for cough and shortness of breath.   Cardiovascular: Negative.  Negative for chest pain and palpitations.  Gastrointestinal:  Negative for abdominal pain, diarrhea, nausea and vomiting.  Skin: Negative.  Negative for rash.  Neurological: Negative.  Negative for dizziness and headaches.  All other systems reviewed and are negative.  Today's Vitals   01/13/22 0850  BP: 122/84  Pulse: 87  Temp: 98.2 F (36.8 C)  TempSrc: Oral  SpO2: 95%  Weight: 225 lb 4 oz (102.2 kg)  Height: 5\' 9"  (1.753 m)   Body mass index is 33.26 kg/m.   Physical  Exam Vitals reviewed.  Constitutional:      Appearance: Normal appearance.  HENT:     Head: Normocephalic.  Eyes:     Extraocular Movements: Extraocular movements intact.     Pupils: Pupils are equal, round, and reactive to light.  Cardiovascular:     Rate and Rhythm: Normal rate and regular rhythm.     Pulses: Normal pulses.     Heart sounds: Normal heart sounds.  Pulmonary:     Effort: Pulmonary effort is normal.     Breath sounds: Normal breath sounds.  Abdominal:     Palpations: Abdomen is soft.     Tenderness: There is no abdominal tenderness.  Genitourinary:    Rectum: Mass (Tender left perianal mass.  No fluctuation or drainage), tenderness and anal fissure (Superficial fissure.  No bleeding.) present. No external hemorrhoid.  Musculoskeletal:        General: Normal range of motion.  Skin:    General: Skin is warm and dry.  Neurological:     General: No focal deficit present.     Mental Status: He is alert and oriented to person, place, and time.  Psychiatric:        Mood and Affect: Mood normal.        Behavior: Behavior normal.     ASSESSMENT & PLAN: A total of 45 minutes was spent with the patient and counseling/coordination of care regarding preparing for this office, review of most recent office visit notes, review of most recent urgent care visit notes, diagnosis of perianal abscess and treatment including antibiotics and need for GI/colorectal evaluations, possible colonoscopy, prognosis, documentation and need for follow-up.  Problem List Items Addressed This Visit       Digestive   Anal fissure    Not very deep and not bleeding. May be the source of perianal abscess.      Relevant Orders   Ambulatory referral to Gastroenterology     Other   Perianal abscess - Primary    Will need evaluation by GI and possibly colorectal surgeon.  May need drainage. We will start Augmentin 875 twice a day for 7 days. Sitz bath recommended Tylenol and or Advil for  pain      Relevant Medications   amoxicillin-clavulanate (AUGMENTIN) 875-125 MG tablet   Other Relevant Orders   Ambulatory referral to Gastroenterology   Patient Instructions  Absceso anorrectal Anorectal Abscess Un absceso es una zona infectada que contiene una acumulacin de pus. Un absceso anorrectal es un absceso que est cerca de la abertura del ano o en los alrededores del  recto. Sin tratamiento, un absceso anorrectal se puede agrandar y provocar otros problemas, como una infeccin ms grave en todo el cuerpo o dolor, especialmente al [employer]. Cules son las causas? La causa de esta afeccin es la obstruccin de las glndulas o una infeccin en una de estas zonas: El ano. La zona que est entre el ano y Insurance account manager en los hombres o entre el ano y la vagina en las mujeres (perineo). Qu incrementa el riesgo? Los siguientes factores pueden hacer que sea ms propenso a Aeronautical engineer afeccin: Diabetes o enfermedad inflamatoria del intestino. Debilitamiento del sistema de defensa del cuerpo (sistema inmunitario). Tener sexo anal. Winferd Humphrey infeccin de transmisin sexual (ITS). Determinados tipos de cncer, como el carcinoma rectal, la leucemia o el linfoma. Cules son los signos o sntomas? El sntoma principal de esta afeccin es Chief Technology Officer. Este puede ser un dolor pulsante que empeora al defecar. Otros sntomas pueden incluir los siguientes: Hinchazn y enrojecimiento en la zona del absceso. El enrojecimiento se puede extender ms all del absceso y convertirse en una lnea roja en la piel. Un bulto visible y doloroso o un bulto que se puede sentir a Insurance claims handler. Hemorragia o secrecin parecida al pus en la zona. Grant Ruts. Debilidad general. Estreimiento. Diarrea. Cmo se diagnostica? Esta afeccin se diagnostica en funcin de sus antecedentes mdicos y de un examen fsico de la zona afectada. Esto puede incluir el examen del rea rectal con la mano enguantada (examen  rectal digital). En ocasiones, el mdico debe examinar el recto mediante el uso de [place], un endoscopio o un estudio de diagnstico por imgenes. En el caso de las mujeres, posiblemente se requiera un examen exhaustivo de la vagina. Cmo se trata? El tratamiento de esta afeccin puede incluir lo siguiente: [place] con incisin y drenaje. Esta implica realizar una incisin en el absceso para drenar el pus. Medicamentos, como antibiticos, analgsicos, medicamentos para ablandar las heces o laxantes. Siga estas instrucciones en su casa: Medicamentos Tome los medicamentos de venta libre y los recetados solamente como se lo haya indicado el mdico. Si le recetaron un antibitico, tmelo como se lo haya indicado el mdico. No deje de usar el antibitico aunque comience a Actor. No conduzca ni use maquinaria pesada mientras toma analgsicos recetados. Cuidados de la herida  Si se [place] [employer], siga las instrucciones del mdico sobre cmo retirarla o [place]. Normalmente se puede retirar tras 2 o 3 das. Lvese las manos con agua y jabn antes de retirar o Psychologist, educational gasa. Use desinfectante para manos si no dispone de [place] y [place]. Si se colocaron uno o ms drenajes en la cavidad del absceso, tenga cuidado de no tirar de [place]. El mdico le dir cunto tiempo debe tenerlos all. Controle la zona de la incisin todos los [address]  [address] para detectar signos de infeccin. Est atento a los siguientes signos: Aumento del enrojecimiento, la hinchazn o Chief Technology Officer. Ms lquido [place]. Calor. Pus o mal olor. Control del dolor, el adormecimiento y la hinchazn  Tome baos de asiento entre 3 y 4 veces por da y despus de Advertising copywriter. Esto ayudar a reducir Chief Technology Officer y la hinchazn. Para aliviar el dolor, pruebe sentarse: Sobre una almohadilla trmica con el ajuste en temperatura baja. Sobre un almohadn inflable con forma de aro. Si se lo indican, aplique hielo sobre la zona afectada: [place]  el hielo en una bolsa plstica. Coloque una toalla entre la piel y Copy. Aplique el hielo durante  20 minutos, 2 o 3 veces por da. Instrucciones generales Siga las indicaciones proporcionadas por su mdico con respecto a la alimentacin. Concurre a todas las visitas de [address] te lo haya indicado el mdico. Esto es importante. Comunquese con un mdico si tiene: Sangrado de la incisin. Dolor, hinchazn o enrojecimiento que no mejora o que empeora. Dificultad para defecar u orinar. Sntomas que reaparecen despus del tratamiento. Solicite ayuda de inmediato si: Tiene problemas para mover o usar las piernas. El dolor es intenso o [place]. La hinchazn en la zona afectada empeora repentinamente. El sangrado o la secrecin de pus aumenta demasiado. Tiene escalofros o fiebre. Resumen Un absceso anorrectal es un absceso que est cerca de la abertura del ano o en los alrededores del recto. Un absceso es una zona infectada que contiene una acumulacin de pus. El sntoma principal de esta afeccin es Chief Technology Officer. Puede ser un dolor pulsante que empeora al defecar. El tratamiento de un absceso anorrectal puede incluir ciruga para drenar el pus del absceso. Los [employer] y los baos de asiento pueden ser tambin parte del plan de tratamiento. Esta informacin no tiene Theme park manager el consejo del mdico. Asegrese de hacerle al mdico cualquier pregunta que tenga. Document Revised: 09/28/2020 Document Reviewed: 09/28/2020 Elsevier Patient Education  2023 Elsevier Inc.    Edwina Barth, MD [facility]

## 2022-01-13 NOTE — Assessment & Plan Note (Signed)
Will need evaluation by GI and possibly colorectal surgeon.  May need drainage. We will start Augmentin 875 twice a day for 7 days. Sitz bath recommended Tylenol and or Advil for pain

## 2022-01-13 NOTE — Patient Instructions (Signed)
Absceso anorrectal Anorectal Abscess Un absceso es una zona infectada que contiene una acumulacin de pus. Un absceso anorrectal es un absceso que est cerca de la abertura del ano o en los alrededores del recto. Sin tratamiento, un absceso anorrectal se puede agrandar y provocar otros problemas, como una infeccin ms grave en todo el cuerpo o dolor, especialmente al [employer]. Cules son las causas? La causa de esta afeccin es la obstruccin de las glndulas o una infeccin en una de estas zonas: El ano. La zona que est entre el ano y Estate manager/land agent en los hombres o entre el ano y la vagina en las mujeres (perineo). Qu incrementa el riesgo? Los siguientes factores pueden hacer que sea ms propenso a Armed forces training and education officer afeccin: Diabetes o enfermedad inflamatoria del intestino. Debilitamiento del sistema de defensa del cuerpo (sistema inmunitario). Tener sexo anal. Lucilla Edin infeccin de transmisin sexual (ITS). Determinados tipos de cncer, como el carcinoma rectal, la leucemia o el linfoma. Cules son los signos o sntomas? El sntoma principal de esta afeccin es Conservation officer, historic buildings. Este puede ser un dolor pulsante que empeora al defecar. Otros sntomas pueden incluir los siguientes: Hinchazn y enrojecimiento en la zona del absceso. El enrojecimiento se puede extender ms all del absceso y convertirse en una lnea roja en la piel. Un bulto visible y doloroso o un bulto que se puede sentir a Secretary/administrator. Hemorragia o secrecin parecida al pus en la zona. Cristy Hilts. Debilidad general. Estreimiento. Diarrea. Cmo se diagnostica? Esta afeccin se diagnostica en funcin de sus antecedentes mdicos y de un examen fsico de la zona afectada. Esto puede incluir el examen del rea rectal con la mano enguantada (examen rectal digital). En ocasiones, el mdico debe examinar el recto mediante el uso de [place], un endoscopio o un estudio de diagnstico por imgenes. En el caso de las mujeres,  posiblemente se requiera un examen exhaustivo de la vagina. Cmo se trata? El tratamiento de esta afeccin puede incluir lo siguiente: [place] con incisin y drenaje. Esta implica realizar una incisin en el absceso para drenar el pus. Medicamentos, como antibiticos, analgsicos, medicamentos para ablandar las heces o laxantes. Siga estas instrucciones en su casa: Medicamentos Tome los medicamentos de venta libre y los recetados solamente como se lo haya indicado el mdico. Si le recetaron un antibitico, tmelo como se lo haya indicado el mdico. No deje de usar el antibitico aunque comience a Sports administrator. No conduzca ni use maquinaria pesada mientras toma analgsicos recetados. Cuidados de la herida  Si se [place] [employer], siga las instrucciones del mdico sobre cmo retirarla o [place]. Normalmente se puede retirar tras 2 o 3 das. Lvese las manos con agua y jabn antes de retirar o Agricultural engineer gasa. Use desinfectante para manos si no dispone de [place] y [place]. Si se colocaron uno o ms drenajes en la cavidad del absceso, tenga cuidado de no tirar de [place]. El mdico le dir cunto tiempo debe tenerlos all. Controle la zona de la incisin todos los das para detectar signos de infeccin. Est atento a los siguientes signos: Aumento del enrojecimiento, la hinchazn o Conservation officer, historic buildings. Ms lquido Delorise Shiner. [place]. Pus o mal olor. Control del dolor, el adormecimiento y la hinchazn  Tome baos de asiento entre 3 y 4 veces por da y despus de Landscape architect. Esto ayudar a reducir Conservation officer, historic buildings y la hinchazn. Para aliviar el dolor, pruebe sentarse: Sobre una almohadilla trmica con el ajuste en temperatura baja. Sobre un almohadn inflable  con forma de aro. Si se lo indican, aplique hielo sobre la zona afectada: [place] el hielo en una bolsa plstica. Coloque una toalla entre la piel y Copy. Aplique el hielo durante 20 minutos, 2 o 3 veces por da. Instrucciones generales Siga las  indicaciones proporcionadas por su mdico con respecto a la alimentacin. Concurre a todas las visitas de [address] te lo haya indicado el mdico. Esto es importante. Comunquese con un mdico si tiene: Sangrado de la incisin. Dolor, hinchazn o enrojecimiento que no mejora o que empeora. Dificultad para defecar u orinar. Sntomas que reaparecen despus del tratamiento. Solicite ayuda de inmediato si: Tiene problemas para mover o usar las piernas. El dolor es intenso o [place]. La hinchazn en la zona afectada empeora repentinamente. El sangrado o la secrecin de pus aumenta demasiado. Tiene escalofros o fiebre. Resumen Un absceso anorrectal es un absceso que est cerca de la abertura del ano o en los alrededores del recto. Un absceso es una zona infectada que contiene una acumulacin de pus. El sntoma principal de esta afeccin es Chief Technology Officer. Puede ser un dolor pulsante que empeora al defecar. El tratamiento de un absceso anorrectal puede incluir ciruga para drenar el pus del absceso. Los [employer] y los baos de asiento pueden ser tambin parte del plan de tratamiento. Esta informacin no tiene Theme park manager el consejo del mdico. Asegrese de hacerle al mdico cualquier pregunta que tenga. Document Revised: 09/28/2020 Document Reviewed: 09/28/2020 Elsevier Patient Education  2023 [employer].

## 2022-01-13 NOTE — Assessment & Plan Note (Signed)
Not very deep and not bleeding. May be the source of perianal abscess.

## 2022-01-14 ENCOUNTER — Telehealth: Payer: Self-pay | Admitting: Emergency Medicine

## 2022-01-14 ENCOUNTER — Other Ambulatory Visit: Payer: Self-pay | Admitting: Emergency Medicine

## 2022-01-14 DIAGNOSIS — K610 Anal abscess: Secondary | ICD-10-CM

## 2022-01-14 MED ORDER — AMOXICILLIN-POT CLAVULANATE 875-125 MG PO TABS: 1.0000 | ORAL_TABLET | Freq: Two times a day (BID) | ORAL | 0 refills | Status: AC

## 2022-01-14 MED ORDER — HYDROCORTISONE ACETATE 25 MG RE SUPP: 25.0000 mg | Freq: Two times a day (BID) | RECTAL | 0 refills | Status: DC

## 2022-01-14 NOTE — Telephone Encounter (Signed)
New prescription for suppository sent to pharmacy requested.  Thanks.

## 2022-01-14 NOTE — Telephone Encounter (Signed)
Augmentin sent to another pharmacy.  Patient states a rx for suppositories was suppose to be sent as well. Please send to [facility]

## 2022-01-14 NOTE — Telephone Encounter (Signed)
Patient states that the pharmacy did not get the antibiotic or the suppositories that were sent in yesterday.  Can you please resend to [address] [facility] [address]

## 2022-02-06 ENCOUNTER — Telehealth: Payer: Self-pay | Admitting: Emergency Medicine

## 2022-02-06 NOTE — Telephone Encounter (Signed)
Pt said he finished the medication and he is still in pain. Can another medication be sent to the pharmacy? Pt did not contact gastroenterology due to no insurance. Explained he can obtain an estimate from them for an initial visit. Please contact pt about medicine for pain.

## 2022-02-08 NOTE — Telephone Encounter (Signed)
He has a perianal abscess and he is still having pain.  We saw him in the office 01/13/2022.  He was referred to Systems developer.  Will need drainage.  Needs to be seen at urgent care center or emergency department if he still having this much pain.  Thanks.

## 2022-02-10 NOTE — Telephone Encounter (Signed)
Called patient and left message for patient to call office for provider recommendation 

## 2022-02-18 ENCOUNTER — Encounter: Payer: Self-pay | Admitting: Gastroenterology

## 2022-03-27 ENCOUNTER — Ambulatory Visit: Payer: Self-pay | Admitting: Gastroenterology

## 2022-03-27 ENCOUNTER — Encounter: Payer: Self-pay | Admitting: Gastroenterology

## 2022-03-27 VITALS — BP 140/102 | HR 91 | Ht 69.0 in | Wt 225.0 lb

## 2022-03-27 DIAGNOSIS — K610 Anal abscess: Secondary | ICD-10-CM

## 2022-03-27 MED ORDER — METRONIDAZOLE 500 MG PO TABS: 500.0000 mg | ORAL_TABLET | Freq: Three times a day (TID) | ORAL | 0 refills | Status: DC

## 2022-03-27 MED ORDER — CIPROFLOXACIN HCL 500 MG PO TABS: 500.0000 mg | ORAL_TABLET | Freq: Two times a day (BID) | ORAL | 0 refills | Status: DC

## 2022-03-27 NOTE — Patient Instructions (Signed)
_______________________________________________________  If you are age 45 or older, your body mass index should be between 23-30. Your Body mass index is 33.23 kg/m. If this is out of the aforementioned range listed, please consider follow up with your Primary Care Provider.  If you are age 76 or younger, your body mass index should be between 19-25. Your Body mass index is 33.23 kg/m. If this is out of the aformentioned range listed, please consider follow up with your Primary Care Provider.   ________________________________________________________  The [facility] providers would like to encourage you to use [facility] to communicate with providers for non-urgent requests or questions.  Due to long hold times on the telephone, sending your provider a message by [facility] may be a faster and more efficient way to get a response.  Please allow 48 business hours for a response.  Please remember that this is for non-urgent requests.  _______________________________________________________  We have sent the following medications to your pharmacy for you to pick up at your convenience: Cipro Flagyl  A referral has been sent to [facility]. Please call them in 2 weeks if you haven't heard anything from them regarding this appointment. Make certain to bring a list of current medications, including any over the counter medications or vitamins. Also bring your co-pay if you have one as well as your insurance cards. [facility] is located at [address], [address]. Telephone number please contact them at [phone] in 2 weeks if you haven't heard anything.   Alternate with Ibuprofen 635m every 8 hours and Tynelol 7511mevery 8 hours  [facility] the [id number]   You are scheduled on 04-14-2022 at 3pm. You should arrive at 12:30pm for registration. Please follow the written  instructions below on the day of your exam:  WARNING: IF YOU ARE ALLERGIC TO IODINE/X-RAY DYE, PLEASE NOTIFY RADIOLOGY IMMEDIATELY AT [phone] WILL BE GIVEN A 13 HOUR PREMEDICATION PREP.  1) Do not eat or drink anything after 11am (4 hours prior to your test) 2) You have been given 2 bottles of oral contrast to drink. The solution may taste better if refrigerated, but do NOT add ice or any other liquid to this solution. Shake well before drinking.    Drink 1 bottle of contrast @ 1pm (2 hours prior to your exam)  Drink 1 bottle of contrast @ 2pm (1 hour prior to your exam)  You may take any medications as prescribed with a small amount of water, if necessary. If you take any of the following medications: METFORMIN, GLUCOPHAGE, GLUCOVANCE, AVANDAMET, RIOMET, FORTAMET, ACIrwindaleET, JANUMET, GLUMETZA or METAGLIP, you MAY be asked to HOLD this medication 48 hours AFTER the exam.  The purpose of you drinking the oral contrast is to aid in the visualization of your intestinal tract. The contrast solution may cause some diarrhea. Depending on your individual set of symptoms, you may also receive an intravenous injection of x-ray contrast/dye. [facility] or longer, depending on the type of exam you are having performed.  This test typically takes 30-45 minutes to complete.  If you have any questions regarding your exam or if you need to reschedule, you may call the CT department at [phone] the hours of 8:00 am and 5:00 pm, Monday-Friday.  ________________________________________________________________________  It was a pleasure to see you today!  Thank  you for trusting me with your gastrointestinal care!

## 2022-03-27 NOTE — Progress Notes (Signed)
HPI : Travis Ryan is a very pleasant 47 year old male with no chronic medical history who is referred to [place] by Dr. Agustina Caroli for further evaluation and management of perianal pain.  He states that he started having the pain back in October.  He went to urgent care and he was treated for an anal fissure.  He followed up with Dr. Mitchel Honour on October 23, at which time he was noted to have a tender left perianal mass without fluctuation or drainage suspicious for an abscess and a suspected anal fissure.  No hemorrhoids.  He was prescribed Augmentin, nifedipine cream and anusol suppositories.  He was referred to GI and colorectal surgery but did not make an appointment at that time due to lack of insurance.   He states that the pain has not resolved at all since then.  For a while, it was worse, but it seems to have improved recently with use of topical Neosporin, aloe in the tea that his girlfriend makes for him.  He describes pain and pressure in the perineal region that is worse with activity.  He reports drainage of yellowish and sometimes bloody fluid, which is not copious but is enough that he has started to wear feminine pads.  Sometimes this pain radiates down his leg. He denies pain with the passage of stool, but does report pain with wiping.  He sees blood and pus on the toilet paper when he wipes, but denies lots of blood in the stool or in the toilet water.  He had had some fevers and chills a couple months ago, but none recently.  He denies any other GI symptoms such as abdominal pain, diarrhea, constipation, nausea/vomiting or weight loss.  He has no known family history of inflammatory bowel disease or colon cancer. He has never had any previous problems with perianal abscesses, anal fissures, hemorrhoids or other perianal disease.    History reviewed. No pertinent past medical history.   Past Surgical History:  Procedure Laterality Date   HERNIA REPAIR     Family History   Problem Relation Age of Onset   Cancer Maternal Grandmother    Early death Maternal Grandfather    Cancer Paternal Grandmother    Early death Paternal Grandfather    Stomach cancer Neg Hx    Esophageal cancer Neg Hx    Colon cancer Neg Hx    Social History   Tobacco Use   Smoking status: Former    Types: Cigarettes   Smokeless tobacco: Never  Vaping Use   Vaping Use: Never used  Substance Use Topics   Alcohol use: Yes    Comment: beer and liqour   Drug use: Yes   Current Outpatient Medications  Medication Sig Dispense Refill   cetirizine (ZYRTEC ALLERGY) 10 MG tablet Take 1 tablet (10 mg total) by mouth daily. 30 tablet 0   Methylene Blue, Antidote, 1 % SOLN Inject into the vein.     acetaminophen (TYLENOL) 500 MG tablet Take 2 tablets (1,000 mg total) by mouth every 6 (six) hours as needed. (Patient not taking: Reported on 03/27/2022) 30 tablet 0   fexofenadine (ALLEGRA) 180 MG tablet Take 180 mg by mouth daily. (Patient not taking: Reported on 03/27/2022)     hydrocortisone (ANUSOL-HC) 25 MG suppository Place 1 suppository (25 mg total) rectally 2 (two) times daily. (Patient not taking: Reported on 03/27/2022) 12 suppository 0   nifedipine 0.3 % ointment Place 1 Application rectally 4 (four) times daily. (  Patient not taking: Reported on 03/27/2022) 30 g 0   phenol (CHLORASEPTIC) 1.4 % LIQD Use as directed 1 spray in the mouth or throat 3 (three) times daily as needed for throat irritation / pain. (Patient not taking: Reported on 03/27/2022) 20 mL 0   No current facility-administered medications for this visit.   No Known Allergies   Review of Systems: All systems reviewed and negative except where noted in HPI.    No results found.  Physical Exam: BP (!) 140/102   Pulse 91   Ht 5\' 9"  (1.753 m)   Wt 225 lb (102.1 kg)   BMI 33.23 kg/m  Constitutional: Pleasant,well-developed, Hispanic male in no acute distress. HEENT: Normocephalic and atraumatic. Conjunctivae are normal.  No scleral icterus. Cardiovascular: Normal rate, regular rhythm.  Pulmonary/chest: Effort normal and breath sounds normal. No wheezing, rales or rhonchi. Abdominal: Soft, nondistended, nontender. Bowel sounds active throughout. There are no masses palpable. No hepatomegaly. Extremities: no edema Rectal: To the left of the anus there was an area of swelling, warmth, and fluctuance within 1 to 2 cm of the anus.  There were 2 focal papular areas of granulation tissue within this region 1 of which was oozing a small amount of yellowish fluid.  This area was very tender to palpation.  Digital rectal exam was deferred Neurological: Alert and oriented to person place and time. Skin: Skin is warm and dry. No rashes noted. Psychiatric: Normal mood and affect. Behavior is normal.  CBC    Component Value Date/Time   WBC 8.6 03/02/2014 1100   RBC 5.41 03/02/2014 1100   HGB 16.1 03/02/2014 1100   HCT 47.1 03/02/2014 1100   MCV 87.1 03/02/2014 1100   MCH 29.7 03/02/2014 1100   MCHC 34.2 03/02/2014 1100    CMP  No results found for: "NA", "K", "CL", "CO2", "GLUCOSE", "BUN", "CREATININE", "CALCIUM", "PROT", "ALBUMIN", "AST", "ALT", "ALKPHOS", "BILITOT", "GFRNONAA", "GFRAA"   ASSESSMENT AND PLAN: 47 year old male with 3 months of perianal pain and drainage of pus and bloody fluid, with focal swelling, warmth and induration adjacent to the anus, consistent with a perianal abscess.  This needs to be incised and drained.  We do not have the capabilities in our office for this.  I recommended the patient go to the emergency department to have this done expedited, but he decided monetary concerns as a reason for not doing this. Given that this has been a chronic issue, and he has not toxic appearing, without fevers, this likely can be managed safely in an outpatient setting.  I will contact our surgery colleagues to see if they can get him in soon for an office-based I&D.  In the meantime, we will start  antibiotics.  I will order a CT scan to confirm the abscess and further characterize. The patient does not have any other GI symptoms to suggest Crohn's disease.  I do not think he needs a colonoscopy urgently.  He is aware that he will be due for a screening colonoscopy next year.  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  Perianal abscess - Cipro/Flagyl x 14 days - CT pelvis - Colorectal consult - Recommended ED  Tanise Russman E. Candis Schatz, MD [facility]  CC:  Mitchel Honour [place]

## 2022-04-14 ENCOUNTER — Ambulatory Visit (HOSPITAL_COMMUNITY)
Admission: RE | Admit: 2022-04-14 | Discharge: 2022-04-14 | Disposition: A | Discharge status: Home / Self Care | Payer: Self-pay | Source: Ambulatory Visit | Attending: Gastroenterology | Admitting: Gastroenterology

## 2022-04-14 DIAGNOSIS — K610 Anal abscess: Secondary | ICD-10-CM | POA: Insufficient documentation

## 2022-04-14 MED ORDER — SODIUM CHLORIDE (PF) 0.9 % IJ SOLN
INTRAMUSCULAR | Status: AC
  Filled 2022-04-14: qty 50

## 2022-04-14 MED ORDER — IOHEXOL 300 MG/ML  SOLN
100.0000 mL | Freq: Once | INTRAMUSCULAR | Status: AC | PRN
  Administered 2022-04-14: 100 mL via INTRAVENOUS

## 2022-04-24 ENCOUNTER — Other Ambulatory Visit: Payer: Self-pay

## 2022-04-24 ENCOUNTER — Other Ambulatory Visit (INDEPENDENT_AMBULATORY_CARE_PROVIDER_SITE_OTHER): Payer: Self-pay

## 2022-04-24 DIAGNOSIS — R9389 Abnormal findings on diagnostic imaging of other specified body structures: Secondary | ICD-10-CM

## 2022-04-24 LAB — PSA: PSA: 0.76 ng/mL (ref 0.10–4.00)

## 2022-04-24 NOTE — Progress Notes (Signed)
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  No abscess was present.  I see you are already scheduled for surgery with Dr. Dema Severin in a few weeks.  I do not recommend any additional treatment.  The radiologist also noted a low density area in the prostate.  Abnormalities in the prostate can sometimes represent prostate cancer.  I would like to check a blood test that is associated with prostate cancer and then have you follow up with Dr. Mitchel Honour.  Please come by our office for the blood test at your convenience.  [name],  Can you please order a PSA?  Dr. Mitchel Honour,  Would you like the patient to see urology regarding the prostate abnormality seen on CT, or wait on PSA results?

## 2022-04-25 NOTE — Progress Notes (Signed)
Thank you Scott.  Please refer him to urology if you do not mind.  Thanks.

## 2022-04-25 NOTE — Progress Notes (Signed)
Lamir,  Your PSA level was normal this is good news and reassuring.   I spoke with Dr. Mitchel Honour and he did request that we refer you to urology to see if there is any further evaluation that needs to be done for the incidental prostate finding on your CT.  [name],  Can you please place a urology referral for abnormal CT (hypodensity in prostate)?

## 2022-05-12 ENCOUNTER — Encounter (HOSPITAL_BASED_OUTPATIENT_CLINIC_OR_DEPARTMENT_OTHER): Payer: Self-pay | Admitting: Surgery

## 2022-05-12 NOTE — Progress Notes (Signed)
Spoke w/ via phone for pre-op interview--- called number given by office whom was a family relative , Rosemarie Ax, she called pt in for 3 way conversation Lab needs dos----  no (per anes)             Lab results------ no COVID test -----patient states asymptomatic no test needed Arrive at ------- 0530 on 05-15-2022 NPO after MN  Med rec completed Medications to take morning of surgery -----none Diabetic medication ----- n/a Patient instructed no nail polish to be worn day of surgery Patient instructed to bring photo id and insurance card day of surgery Patient aware to have Driver (ride ) / caregiver    for 24 hours after surgery -- sig other, mancy Patient Special Instructions ----- n/a Pre-Op special Istructions ----- pt request interpreter dos since it does competed understand medical terminology, claudia did have to help him out several times with the questions but otherwise did understand some english.  Sent request via email to [facility] interpreting , no gender preference per pt, copy w/ chart. Sent inbox message to dr white in epic , requested orders Patient verbalized understanding of instructions that were given at this phone interview. Patient denies shortness of breath, chest pain, fever, cough at this phone interview.

## 2022-05-14 ENCOUNTER — Ambulatory Visit: Payer: Self-pay | Admitting: Surgery

## 2022-05-14 DIAGNOSIS — Z01818 Encounter for other preprocedural examination: Secondary | ICD-10-CM

## 2022-05-15 ENCOUNTER — Encounter (HOSPITAL_BASED_OUTPATIENT_CLINIC_OR_DEPARTMENT_OTHER): Payer: Self-pay | Admitting: Surgery

## 2022-05-15 ENCOUNTER — Other Ambulatory Visit: Payer: Self-pay

## 2022-05-15 ENCOUNTER — Ambulatory Visit (HOSPITAL_BASED_OUTPATIENT_CLINIC_OR_DEPARTMENT_OTHER): Payer: Self-pay | Admitting: Anesthesiology

## 2022-05-15 ENCOUNTER — Encounter (HOSPITAL_BASED_OUTPATIENT_CLINIC_OR_DEPARTMENT_OTHER)
Admission: RE | Disposition: A | Discharge status: Home / Self Care | Payer: Self-pay | Source: Ambulatory Visit | Attending: Surgery

## 2022-05-15 ENCOUNTER — Ambulatory Visit (HOSPITAL_BASED_OUTPATIENT_CLINIC_OR_DEPARTMENT_OTHER)
Admission: RE | Admit: 2022-05-15 | Discharge: 2022-05-15 | Disposition: A | Discharge status: Home / Self Care | Payer: Self-pay | Source: Ambulatory Visit | Attending: Surgery | Admitting: Surgery

## 2022-05-15 DIAGNOSIS — Z09 Encounter for follow-up examination after completed treatment for conditions other than malignant neoplasm: Secondary | ICD-10-CM | POA: Insufficient documentation

## 2022-05-15 DIAGNOSIS — Z01818 Encounter for other preprocedural examination: Secondary | ICD-10-CM

## 2022-05-15 DIAGNOSIS — Z87891 Personal history of nicotine dependence: Secondary | ICD-10-CM | POA: Insufficient documentation

## 2022-05-15 DIAGNOSIS — K603 Anal fistula: Secondary | ICD-10-CM | POA: Insufficient documentation

## 2022-05-15 HISTORY — DX: Anal fistula, unspecified: K60.30

## 2022-05-15 HISTORY — DX: Rectal abscess: K61.1

## 2022-05-15 HISTORY — DX: Anal fistula: K60.3

## 2022-05-15 HISTORY — DX: Other seasonal allergic rhinitis: J30.2

## 2022-05-15 HISTORY — PX: FISTULOTOMY: SHX6413

## 2022-05-15 LAB — CBC WITH DIFFERENTIAL/PLATELET
Abs Immature Granulocytes: 0.05 10*3/uL (ref 0.00–0.07)
Basophils Absolute: 0.1 10*3/uL (ref 0.0–0.1)
Basophils Relative: 1 %
Eosinophils Absolute: 0.3 10*3/uL (ref 0.0–0.5)
Eosinophils Relative: 3 %
HCT: 46 % (ref 39.0–52.0)
Hemoglobin: 15.9 g/dL (ref 13.0–17.0)
Immature Granulocytes: 1 %
Lymphocytes Relative: 16 %
Lymphs Abs: 1.4 10*3/uL (ref 0.7–4.0)
MCH: 29.5 pg (ref 26.0–34.0)
MCHC: 34.6 g/dL (ref 30.0–36.0)
MCV: 85.3 fL (ref 80.0–100.0)
Monocytes Absolute: 0.6 10*3/uL (ref 0.1–1.0)
Monocytes Relative: 7 %
Neutro Abs: 6.3 10*3/uL (ref 1.7–7.7)
Neutrophils Relative %: 72 %
Platelets: 200 10*3/uL (ref 150–400)
RBC: 5.39 MIL/uL (ref 4.22–5.81)
RDW: 12 % (ref 11.5–15.5)
WBC: 8.7 10*3/uL (ref 4.0–10.5)
nRBC: 0 % (ref 0.0–0.2)

## 2022-05-15 LAB — BASIC METABOLIC PANEL
Anion gap: 9 (ref 5–15)
BUN: 15 mg/dL (ref 6–20)
CO2: 24 mmol/L (ref 22–32)
Calcium: 9.2 mg/dL (ref 8.9–10.3)
Chloride: 104 mmol/L (ref 98–111)
Creatinine, Ser: 0.81 mg/dL (ref 0.61–1.24)
GFR, Estimated: >60 mL/min (ref >60)
Glucose, Bld: 157 mg/dL — ABNORMAL HIGH (ref 70–99)
Potassium: 3.6 mmol/L (ref 3.5–5.1)
Sodium: 137 mmol/L (ref 135–145)

## 2022-05-15 SURGERY — FISTULOTOMY
Anesthesia: General

## 2022-05-15 MED ORDER — ACETAMINOPHEN 500 MG PO TABS
ORAL_TABLET | ORAL | Status: AC
  Filled 2022-05-15: qty 2

## 2022-05-15 MED ORDER — KETOROLAC TROMETHAMINE 30 MG/ML IJ SOLN: 30.0000 mg | Freq: Once | INTRAMUSCULAR | Status: DC | PRN

## 2022-05-15 MED ORDER — BUPIVACAINE-EPINEPHRINE 0.25% -1:200000 IJ SOLN
INTRAMUSCULAR | Status: DC | PRN
  Administered 2022-05-15: 30 mL

## 2022-05-15 MED ORDER — LACTATED RINGERS IV SOLN: INTRAVENOUS | Status: DC

## 2022-05-15 MED ORDER — ONDANSETRON HCL 4 MG/2ML IJ SOLN: 4.0000 mg | Freq: Once | INTRAMUSCULAR | Status: DC | PRN

## 2022-05-15 MED ORDER — OXYCODONE HCL 5 MG/5ML PO SOLN: 5.0000 mg | Freq: Once | ORAL | Status: DC | PRN

## 2022-05-15 MED ORDER — 0.9 % SODIUM CHLORIDE (POUR BTL) OPTIME
TOPICAL | Status: DC | PRN
  Administered 2022-05-15: 1000 mL

## 2022-05-15 MED ORDER — ROCURONIUM BROMIDE 10 MG/ML (PF) SYRINGE
PREFILLED_SYRINGE | INTRAVENOUS | Status: DC | PRN
  Administered 2022-05-15: 70 mg via INTRAVENOUS

## 2022-05-15 MED ORDER — FENTANYL CITRATE (PF) 100 MCG/2ML IJ SOLN
INTRAMUSCULAR | Status: DC | PRN
  Administered 2022-05-15: 100 ug via INTRAVENOUS
  Administered 2022-05-15: 50 ug via INTRAVENOUS

## 2022-05-15 MED ORDER — ONDANSETRON HCL 4 MG/2ML IJ SOLN
INTRAMUSCULAR | Status: AC
  Filled 2022-05-15: qty 2

## 2022-05-15 MED ORDER — BUPIVACAINE LIPOSOME 1.3 % IJ SUSP
INTRAMUSCULAR | Status: DC | PRN
  Administered 2022-05-15: 20 mL

## 2022-05-15 MED ORDER — BUPIVACAINE LIPOSOME 1.3 % IJ SUSP: 20.0000 mL | Freq: Once | INTRAMUSCULAR | Status: DC

## 2022-05-15 MED ORDER — TRAMADOL HCL 50 MG PO TABS: 50.0000 mg | ORAL_TABLET | Freq: Four times a day (QID) | ORAL | 0 refills | Status: AC | PRN

## 2022-05-15 MED ORDER — DEXMEDETOMIDINE HCL IN NACL 80 MCG/20ML IV SOLN
INTRAVENOUS | Status: AC
  Filled 2022-05-15: qty 20

## 2022-05-15 MED ORDER — PROPOFOL 10 MG/ML IV BOLUS
INTRAVENOUS | Status: AC
  Filled 2022-05-15: qty 20

## 2022-05-15 MED ORDER — LIDOCAINE 2% (20 MG/ML) 5 ML SYRINGE
INTRAMUSCULAR | Status: DC | PRN
  Administered 2022-05-15: 100 mg via INTRAVENOUS

## 2022-05-15 MED ORDER — PROPOFOL 10 MG/ML IV BOLUS
INTRAVENOUS | Status: DC | PRN
  Administered 2022-05-15: 200 mg via INTRAVENOUS

## 2022-05-15 MED ORDER — DEXAMETHASONE SODIUM PHOSPHATE 10 MG/ML IJ SOLN
INTRAMUSCULAR | Status: AC
  Filled 2022-05-15: qty 1

## 2022-05-15 MED ORDER — MIDAZOLAM HCL 2 MG/2ML IJ SOLN
INTRAMUSCULAR | Status: AC
  Filled 2022-05-15: qty 2

## 2022-05-15 MED ORDER — FENTANYL CITRATE (PF) 100 MCG/2ML IJ SOLN: 25.0000 ug | INTRAMUSCULAR | Status: DC | PRN

## 2022-05-15 MED ORDER — ROCURONIUM BROMIDE 10 MG/ML (PF) SYRINGE
PREFILLED_SYRINGE | INTRAVENOUS | Status: AC
  Filled 2022-05-15: qty 10

## 2022-05-15 MED ORDER — FENTANYL CITRATE (PF) 100 MCG/2ML IJ SOLN
INTRAMUSCULAR | Status: AC
  Filled 2022-05-15: qty 2

## 2022-05-15 MED ORDER — METHYLENE BLUE 1 % INJ SOLN
INTRAVENOUS | Status: DC | PRN
  Administered 2022-05-15: 1 mL

## 2022-05-15 MED ORDER — DEXMEDETOMIDINE HCL IN NACL 80 MCG/20ML IV SOLN
INTRAVENOUS | Status: DC | PRN
  Administered 2022-05-15: 12 ug via BUCCAL

## 2022-05-15 MED ORDER — MIDAZOLAM HCL 5 MG/5ML IJ SOLN
INTRAMUSCULAR | Status: DC | PRN
  Administered 2022-05-15: 2 mg via INTRAVENOUS

## 2022-05-15 MED ORDER — DEXAMETHASONE SODIUM PHOSPHATE 10 MG/ML IJ SOLN
INTRAMUSCULAR | Status: DC | PRN
  Administered 2022-05-15: 5 mg via INTRAVENOUS

## 2022-05-15 MED ORDER — ONDANSETRON HCL 4 MG/2ML IJ SOLN
INTRAMUSCULAR | Status: DC | PRN
  Administered 2022-05-15: 4 mg via INTRAVENOUS

## 2022-05-15 MED ORDER — SUGAMMADEX SODIUM 200 MG/2ML IV SOLN
INTRAVENOUS | Status: DC | PRN
  Administered 2022-05-15: 200 mg via INTRAVENOUS

## 2022-05-15 MED ORDER — OXYCODONE HCL 5 MG PO TABS: 5.0000 mg | ORAL_TABLET | Freq: Once | ORAL | Status: DC | PRN

## 2022-05-15 MED ORDER — ACETAMINOPHEN 500 MG PO TABS
1000.0000 mg | ORAL_TABLET | ORAL | Status: AC
  Administered 2022-05-15: 1000 mg via ORAL

## 2022-05-15 SURGICAL SUPPLY — 65 items
APL SKNCLS STERI-STRIP NONHPOA (GAUZE/BANDAGES/DRESSINGS) ×1
BENZOIN TINCTURE PRP APPL 2/3 (GAUZE/BANDAGES/DRESSINGS) ×2 IMPLANT
BLADE EXTENDED COATED 6.5IN (ELECTRODE) IMPLANT
BLADE SURG 10 STRL SS (BLADE) IMPLANT
BLADE SURG 15 STRL LF DISP TIS (BLADE) ×2 IMPLANT
BLADE SURG 15 STRL SS (BLADE) ×1
BRIEF MESH DISP LRG (UNDERPADS AND DIAPERS) ×2 IMPLANT
COVER BACK TABLE 60X90IN (DRAPES) ×2 IMPLANT
COVER MAYO STAND STRL (DRAPES) ×2 IMPLANT
DRAPE HYSTEROSCOPY (MISCELLANEOUS) IMPLANT
DRAPE LAPAROTOMY 100X72 PEDS (DRAPES) ×2 IMPLANT
DRAPE SHEET LG 3/4 BI-LAMINATE (DRAPES) IMPLANT
DRAPE UTILITY XL STRL (DRAPES) ×2 IMPLANT
GAUZE 4X4 16PLY ~~LOC~~+RFID DBL (SPONGE) ×2 IMPLANT
GAUZE PAD ABD 8X10 STRL (GAUZE/BANDAGES/DRESSINGS) ×2 IMPLANT
GAUZE SPONGE 4X4 12PLY STRL (GAUZE/BANDAGES/DRESSINGS) IMPLANT
GLOVE BIO SURGEON STRL SZ7.5 (GLOVE) ×2 IMPLANT
GLOVE INDICATOR 8.0 STRL GRN (GLOVE) ×2 IMPLANT
GOWN STRL REUS W/TWL XL LVL3 (GOWN DISPOSABLE) ×2 IMPLANT
HYDROGEN PEROXIDE 16OZ (MISCELLANEOUS) IMPLANT
IV CATH 14GX2 1/4 (CATHETERS) IMPLANT
IV CATH 18G SAFETY (IV SOLUTION) IMPLANT
KIT SIGMOIDOSCOPE (SET/KITS/TRAYS/PACK) IMPLANT
KIT TURNOVER CYSTO (KITS) ×2 IMPLANT
LEGGING LITHOTOMY PAIR STRL (DRAPES) IMPLANT
LOOP VASCLR MAXI BLUE 18IN ST (MISCELLANEOUS) IMPLANT
LOOP VASCULAR MAXI 18 BLUE (MISCELLANEOUS) ×1
LOOPS VASCLR MAXI BLUE 18IN ST (MISCELLANEOUS) ×1 IMPLANT
NDL HYPO 22X1.5 SAFETY MO (MISCELLANEOUS) ×2 IMPLANT
NDL HYPO 25X1 1.5 SAFETY (NEEDLE) IMPLANT
NDL SAFETY ECLIP 18X1.5 (MISCELLANEOUS) IMPLANT
NEEDLE HYPO 22X1.5 SAFETY MO (MISCELLANEOUS) ×1 IMPLANT
NEEDLE HYPO 25X1 1.5 SAFETY (NEEDLE) IMPLANT
NEEDLE SAFETY HYPO 22GAX1.5 (MISCELLANEOUS) ×1
NS IRRIG 500ML POUR BTL (IV SOLUTION) ×2 IMPLANT
PACK BASIN DAY SURGERY FS (CUSTOM PROCEDURE TRAY) ×2 IMPLANT
PENCIL SMOKE EVACUATOR (MISCELLANEOUS) ×2 IMPLANT
SLEEVE SCD COMPRESS KNEE MED (STOCKING) ×2 IMPLANT
SPIKE FLUID TRANSFER (MISCELLANEOUS) ×2 IMPLANT
SPONGE HEMORRHOID 8X3CM (HEMOSTASIS) IMPLANT
SPONGE SURGIFOAM ABS GEL 12-7 (HEMOSTASIS) IMPLANT
SUT CHROMIC 2 0 SH (SUTURE) IMPLANT
SUT CHROMIC 3 0 SH 27 (SUTURE) IMPLANT
SUT MNCRL AB 4-0 PS2 18 (SUTURE) IMPLANT
SUT SILK 0 TIES 10X30 (SUTURE) ×2 IMPLANT
SUT SILK 2 0 (SUTURE)
SUT SILK 2 0 SH (SUTURE) ×2 IMPLANT
SUT SILK 2-0 18XBRD TIE 12 (SUTURE) IMPLANT
SUT VIC AB 2-0 SH 27 (SUTURE)
SUT VIC AB 2-0 SH 27XBRD (SUTURE) IMPLANT
SUT VIC AB 3-0 SH 18 (SUTURE) IMPLANT
SUT VIC AB 3-0 SH 27 (SUTURE)
SUT VIC AB 3-0 SH 27X BRD (SUTURE) IMPLANT
SUT VIC AB 3-0 SH 27XBRD (SUTURE) IMPLANT
SUT VIC AB 4-0 P-3 18XBRD (SUTURE) IMPLANT
SUT VIC AB 4-0 P3 18 (SUTURE)
SYR 20ML LL LF (SYRINGE) IMPLANT
SYR BULB IRRIG 60ML STRL (SYRINGE) ×2 IMPLANT
SYR CONTROL 10ML LL (SYRINGE) ×2 IMPLANT
SYR TB 1ML LL NO SAFETY (SYRINGE) IMPLANT
TOWEL OR 17X24 6PK STRL BLUE (TOWEL DISPOSABLE) ×2 IMPLANT
TRAY DSU PREP LF (CUSTOM PROCEDURE TRAY) ×2 IMPLANT
TUBE CONNECTING 12X1/4 (SUCTIONS) ×2 IMPLANT
VASCULAR TIE MAXI BLUE 18IN ST (MISCELLANEOUS) ×1
YANKAUER SUCT BULB TIP NO VENT (SUCTIONS) ×2 IMPLANT

## 2022-05-15 NOTE — Transfer of Care (Signed)
Immediate Anesthesia Transfer of Care Note  Patient: Travis Ryan  Procedure(s) Performed: CONTROL OF ANAL FISTULA/INTERROGATION WITH Partial FISTULOTOMY and SETON, POSSIBLE INCISION/DRAINAGE OF PERIRECTAL ABSCESS ANORECTAL EXAM UNDER ANESTHESIA  Patient Location: PACU  Anesthesia Type:General  Level of Consciousness: awake, alert , oriented, and patient cooperative  Airway & Oxygen Therapy: Patient Spontanous Breathing and Patient connected to face mask oxygen  Post-op Assessment: Report given to RN and Post -op Vital signs reviewed and stable  Post vital signs: Reviewed and stable  Last Vitals:  Vitals Value Taken Time  BP 144/96 05/15/22 0834  Temp    Pulse 88 05/15/22 0835  Resp 15 05/15/22 0835  SpO2 100 % 05/15/22 0835  Vitals shown include unvalidated device data.  Last Pain:  Vitals:   05/15/22 [id number]  TempSrc: Oral  PainSc: 3       Patients Stated Pain Goal: 3 ([id number])  Complications: No notable events documented.

## 2022-05-15 NOTE — Anesthesia Postprocedure Evaluation (Signed)
Anesthesia Post Note  Patient: Matias Quraishi  Procedure(s) Performed: CONTROL OF ANAL FISTULA/INTERROGATION WITH Partial FISTULOTOMY and SETON, POSSIBLE INCISION/DRAINAGE OF PERIRECTAL ABSCESS ANORECTAL EXAM UNDER ANESTHESIA     Patient location during evaluation: PACU Anesthesia Type: General Level of consciousness: awake and alert Pain management: pain level controlled Vital Signs Assessment: post-procedure vital signs reviewed and stable Respiratory status: spontaneous breathing, nonlabored ventilation, respiratory function stable and patient connected to nasal cannula oxygen Cardiovascular status: blood pressure returned to baseline and stable Postop Assessment: no apparent nausea or vomiting Anesthetic complications: no  No notable events documented.  Last Vitals:  Vitals:   05/15/22 0845 05/15/22 0900  BP: (!) 141/88 (!) 142/92  Pulse: 87 82  Resp: 15 16  Temp:  36.6 C  SpO2: 94% 92%    Last Pain:  Vitals:   05/15/22 0900  TempSrc:   PainSc: 0-No pain                 Felesia Stahlecker S

## 2022-05-15 NOTE — Anesthesia Procedure Notes (Signed)
Procedure Name: Intubation Date/Time: 05/15/2022 7:36 AM  Performed by: Rogers Blocker, CRNAPre-anesthesia Checklist: Patient identified, Emergency Drugs available, Suction available and Patient being monitored Patient Re-evaluated:Patient Re-evaluated prior to induction Oxygen Delivery Method: Circle System Utilized Preoxygenation: Pre-oxygenation with 100% oxygen Induction Type: IV induction Ventilation: Mask ventilation without difficulty Laryngoscope Size: Mac and 4 Grade View: Grade I Tube type: Oral Tube size: 7.5 mm Number of attempts: 1 Airway Equipment and Method: Stylet and Bite block Placement Confirmation: ETT inserted through vocal cords under direct vision, positive ETCO2 and breath sounds checked- equal and bilateral Secured at: 22 cm Tube secured with: Tape Dental Injury: Teeth and Oropharynx as per pre-operative assessment

## 2022-05-15 NOTE — Discharge Instructions (Addendum)
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 We opened the abscess up. This will close on it's own over the next couple of weeks. The seton we placed is a flexible blue rubber band and helps prevent new abscesses from forming. This will remain in place for the next few months. We will discuss next steps at your planned follow-up appointment. This seton is ok to get wet and when wiping you don't need to worry about accidentally removing it - it's secured well to itself.  DIET: Follow a light bland diet the first 24 hours after arrival home, such as soup, liquids, crackers, etc.  Be sure to include lots of fluids daily.  Avoid fast food or heavy meals as your are more likely to get nauseated.  Eat a low fat diet the next few days after surgery.   Some bleeding with bowel movements is expected for the first couple of days but this should stop in between bowel movements  Take your usually prescribed home medications unless otherwise directed. No foreign bodies per rectum for the next 3 months (enemas, etc)  PAIN CONTROL: It is helpful to take an over-the-counter pain medication regularly for the first few days/weeks.  Choose from the following that works best for you: Ibuprofen (Advil, etc) Three 251m tabs every 6 hours as needed. Acetaminophen (Tylenol, etc) 500-6535mevery 6 hours as needed NOTE: You may take both of these medications together - most patients find it most helpful when alternating between the two (i.e. Ibuprofen at 6am, tylenol at 9am, ibuprofen at 12pm ...)Marland KitchenA  prescription for pain medication may have been prescribed for you at discharge.  Take your pain medication as prescribed.  If you are having problems/concerns with the prescription medicine, please call usKoreaor further advice.  Avoid getting constipated.  Between the surgery and the pain medications, it is common to experience some constipation.  Increasing fluid intake (64oz of  water per day) and taking a fiber supplement (such as Metamucil, Citrucel, FiberCon) 1-2 times a day regularly will usually help prevent this problem from occurring.  Take Miralax (over the counter) 1-2x/day while taking a narcotic pain medication. If no bowel movement after 48hours, you may additionally take a laxative like a bottle of Milk of Magnesia which can be purchased over the counter. Avoid enemas.   Watch out for diarrhea.  If you have many loose bowel movements, simplify your diet to bland foods.  Stop any stool softeners and decrease your fiber supplement. If this worsens or does not improve, please call usKorea Wash / shower every day.  If you were discharged with a dressing, you may remove this the day after your surgery. You may shower normally, getting soap/water on your wound, particularly after bowel movements.  Soaking in a warm bath filled a couple inches ("Sitz bath") is a great way to clean the area after a bowel movement and many patients find it is a way to soothe the area.  ACTIVITIES as tolerated:   You may resume regular (light) daily activities beginning the next day--such as daily self-care, walking, climbing stairs--gradually increasing activities as tolerated.  If you can walk 30 minutes without difficulty, it is safe to try more intense activity such as jogging, treadmill, bicycling, low-impact aerobics, etc. Refrain from any heavy lifting or straining for the first 2 weeks after your procedure, particularly if your surgery was for hemorrhoids. Avoid activities that make your pain worse You may drive  when you are no longer taking prescription pain medication, you can comfortably wear a seatbelt, and you can safely maneuver your car and apply brakes.  FOLLOW UP in our office Please call [facility] at [phone] to set up an appointment to see your surgeon in the office for a follow-up appointment approximately 2 weeks after your surgery. Make sure that you call for this  appointment the day you arrive home to insure a convenient appointment time.  9. If you have disability or family leave forms that need to be completed, you may have them completed by your primary care physician's office; for return to work instructions, please ask our office staff and they will be happy to assist you in obtaining this documentation   When to call us [phone]: Poor pain control Reactions / problems with new medications (rash/itching, etc)  Fever over 101.5 F (38.5 C) Inability to urinate Nausea/vomiting Worsening swelling or bruising Continued bleeding from incision. Increased pain, redness, or drainage from the incision  The clinic staff is available to answer your questions during regular business hours (8:30am-5pm).  Please don't hesitate to call and ask to speak to one of our nurses for clinical concerns.   A surgeon from [facility] is always on call at the hospitals   If you have a medical emergency, go to the nearest emergency room or call 911.   [facility] [address], [place], [place], [place]  [address] MAIN: [phone] FAX: [phone] [email or web address]    No acetaminophen/Tylenol until after 12:45pm today if needed for pain.   Information for Discharge Teaching: EXPAREL (bupivacaine liposome injectable suspension)   Your surgeon or anesthesiologist gave you EXPAREL(bupivacaine) to help control your pain after surgery.  EXPAREL is a local anesthetic that provides pain relief by numbing the tissue around the surgical site. EXPAREL is designed to release pain medication over time and can control pain for up to 72 hours. Depending on how you respond to EXPAREL, you may require less pain medication during your recovery.  Possible side effects: Temporary loss of sensation or ability to move in the area where bupivacaine was injected. Nausea, vomiting, constipation Rarely,  numbness and tingling in your mouth or lips, lightheadedness, or anxiety may occur. Call your doctor right away if you think you may be experiencing any of these sensations, or if you have other questions regarding possible side effects.  Follow all other discharge instructions given to you by your surgeon or nurse. Eat a healthy diet and drink plenty of water or other fluids.  If you return to the hospital for any reason within 96 hours following the administration of EXPAREL, it is important for health care providers to know that you have received this anesthetic. A teal colored band has been placed on your arm with the date, time and amount of EXPAREL you have received in order to alert and inform your health care providers. Please leave this armband in place for the full 96 hours following administration, and then you may remove the band.  Band can be removed on Monday May 19, 2022.    Post Anesthesia Home Care Instructions  Activity: Get plenty of rest for the remainder of the day. A responsible individual must stay with you for 24 hours following the procedure.  For the next 24 hours, DO NOT: -Drive a car -Paediatric nurse -Drink alcoholic beverages -Take any medication unless instructed by your physician -Make any legal decisions or  sign important papers.  Meals: Start with liquid foods such as gelatin or soup. Progress to regular foods as tolerated. Avoid greasy, spicy, heavy foods. If nausea and/or vomiting occur, drink only clear liquids until the nausea and/or vomiting subsides. Call your physician if vomiting continues.  Special Instructions/Symptoms: Your throat may feel dry or sore from the anesthesia or the breathing tube placed in your throat during surgery. If this causes discomfort, gargle with warm salt water. The discomfort should disappear within 24 hours.

## 2022-05-15 NOTE — Anesthesia Preprocedure Evaluation (Signed)
Anesthesia Evaluation  Patient identified by MRN, date of birth, ID band Patient awake    Reviewed: Allergy & Precautions, H&P , NPO status , Patient's Chart, lab work & pertinent test results  Airway Mallampati: II  TM Distance: >3 FB Neck ROM: Full    Dental no notable dental hx.    Pulmonary neg pulmonary ROS, former smoker   Pulmonary exam normal breath sounds clear to auscultation       Cardiovascular negative cardio ROS Normal cardiovascular exam Rhythm:Regular Rate:Normal     Neuro/Psych negative neurological ROS  negative psych ROS   GI/Hepatic negative GI ROS, Neg liver ROS,,,  Endo/Other  negative endocrine ROS    Renal/GU negative Renal ROS  negative genitourinary   Musculoskeletal negative musculoskeletal ROS (+)    Abdominal   Peds negative pediatric ROS (+)  Hematology negative hematology ROS (+)   Anesthesia Other Findings   Reproductive/Obstetrics negative OB ROS                             Anesthesia Physical Anesthesia Plan  ASA: 1  Anesthesia Plan: General   Post-op Pain Management: Tylenol PO (pre-op)*   Induction: Intravenous  PONV Risk Score and Plan: 2 and Ondansetron, Dexamethasone and Treatment may vary due to age or medical condition  Airway Management Planned: Oral ETT  Additional Equipment:   Intra-op Plan:   Post-operative Plan: Extubation in OR  Informed Consent: I have reviewed the patients History and Physical, chart, labs and discussed the procedure including the risks, benefits and alternatives for the proposed anesthesia with the patient or authorized representative who has indicated his/her understanding and acceptance.     Dental advisory given  Plan Discussed with: CRNA and Surgeon  Anesthesia Plan Comments:        Anesthesia Quick Evaluation

## 2022-05-15 NOTE — Op Note (Signed)
05/15/2022  8:21 AM  PATIENT:  Travis Ryan  47 y.o. male  Patient Care Team: Horald Pollen, MD as PCP - General (Internal Medicine) Patient, No Pcp Per (General Practice)  PRE-OPERATIVE DIAGNOSIS:  Anal fistula  POST-OPERATIVE DIAGNOSIS:  Anal fistula with perirectal abscess  PROCEDURE:   Interrogation/control of transsphincteric anal fistula with placement of draining seton Partial fistulotomy Incision and drainage of perirectal abscess Anorectal exam under anesthesia  SURGEON:  Surgeon(s): Ileana Roup, MD  ASSISTANT: OR staff  ANESTHESIA:   local and general  SPECIMEN:  No Specimen  DISPOSITION OF SPECIMEN:  N/A  COUNTS:  Sponge, needle, and instrument counts were reported correct x2 at conclusion.  EBL: 20 mL  Drains: None  PLAN OF CARE: Discharge to home after PACU  PATIENT DISPOSITION:  PACU - hemodynamically stable.  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  2 external openings both of which communicate.  1 internal opening in the left lateral position at the level of the dentate line.  Partial fistulotomy carried out between the 2 external openings to facilitate drainage in the internal most external opening was controlled with a draining blue vessel loop seton to the internal opening.  10 cc of pus was drained from a left ischial rectal fossa abscess.  DESCRIPTION: The patient was identified in the preoperative holding area and taken to the OR. SCDs were applied. He then underwent general endotracheal anesthesia without difficulty. The patient was then rolled onto the OR table in the prone jackknife position. Pressure points were then evaluated and padded. Benzoin was applied to the buttocks and they were gently taped apart.  He was then prepped and draped in usual sterile fashion.  A surgical timeout was performed indicating the correct patient, procedure, and positioning.  A perianal block was then  created using a dilute mixture of 0.25% Marcaine with epinephrine and Exparel.  After ascertaining an appropriate level of anesthesia had been achieved, a well lubricated digital rectal exam was performed. This demonstrated no palpable masses.  A Hill-Ferguson anoscope was into the anal canal and circumferential inspection demonstrated healthy appearing anoderm without any obvious pathology.  No obvious internal opening.  Externally the left gluteal region there are 2 hypertrophic granulation tissue focuses with punctate openings.  The external most wound communicates with the more internal/external opening.  There is a large abscess cavity we encountered in the left ischial rectal fossa.  The abscess at this location was drained through the external most opening.  The more proximal external opening was then cannulated with a fistula probe.  We are not clearly able to delineate the tract into the anal canal but it did probe in that direction.  Dilute methylene blue was then instilled and the internal opening readily identified.  We are then able to pass a flexible fistula probe into the tract and identify the internal opening.  The fistula probe was exchanged for a blue vessel loop seton.  The internal opening rest at the level of the dentate line in the left lateral position.  The more distal external opening communicates with the more proximal external opening.  This tract was then opened in a partial fistulotomy manner to facilitate drainage.  The issue rectal fossa abscess is drained and 10 cc of pus is evacuated.  There is no surrounding erythema.  Source control appears to have been obtained.  The wounds were then irrigated.  Hemostasis is achieved.  Additional local anesthetic is infiltrated into the  tissues around the anal canal.  All counts were reported correct.  A dressing consisting of 4 x 4's, ABD, and mesh underwear was placed after the buttocks were taped.  He was then rolled back onto the  stretcher, awakened from anesthesia, extubated, and transported to the recovery room in satisfactory condition.  DISPOSITION: PACU in satisfactory condition.

## 2022-05-15 NOTE — H&P (Signed)
CC: Here today for surgery  HPI: Travis Ryan is an 47 y.o. male with history of seasonal allergies and prior perianal abscesses, whom is seen in the office today for evaluation. He was seen last week in our urgent office with [place].  First developed some discomfort/perianal pain back in October, 2023. He was found to have a left perianal abscess and suspected anal fissure. He was given Augmentin, nifedipine cream, and Anusol suppositories. He was subsequently referred to see GI-Dr. Candis Schatz and colorectal surgery but did not make an appointment at that time due to lack of insurance. He had persistent to some degree perianal pain in the interim. He notes some minor tight pressure in the perianal area. Some yellowish drainage occasionally as well. Will wear pads to control the amount of drainage she has had in the past.  Denies ever having had a colonoscopy. 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 He underwent incision/drainage in the office. He was subsequently scheduled to see [place] for follow-up. He denies any history of any prior anal abscesses, swelling, or pain prior to October, 2023.  Overall since having an I&D last week, reports all of his symptoms have improved. Currently no significant perianal pain. Scant drainage. No fever or chills.  He is fluent in [place] and was offered but declined medical interpreter - one is now here in preop today  He denies any changes in his health or health hx since we met in the office. States he is ready for surgery  PMH: Seasonal allergies  PSH: I&D of perianal abscess x1 as above  FHx: Denies any known family history of colorectal, breast, endometrial or ovarian cancer  Social Hx: Drinks ~1 beer per day. Denies use of tobacco/illicit drug. 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   Past Medical History:  Diagnosis Date   Anal fistula    Perirectal abscess    Seasonal  allergies     Past Surgical History:  Procedure Laterality Date   UMBILICAL HERNIA REPAIR  2018    Family History  Problem Relation Age of Onset   Cancer Maternal Grandmother    Early death Maternal Grandfather    Cancer Paternal Grandmother    Early death Paternal Grandfather    Stomach cancer Neg Hx    Esophageal cancer Neg Hx    Colon cancer Neg Hx     Social:  reports that he quit smoking about 14 years ago. His smoking use included cigarettes. He has never used smokeless tobacco. He reports current alcohol use of about 3.0 standard drinks of alcohol per week. He reports that he does not use drugs.  Allergies: No Known Allergies  Medications: I have reviewed the patient's current medications.  Results for orders placed or performed during the hospital encounter of 05/15/22 (from the past 48 hour(s))  CBC with Differential/Platelet     Status: None   Collection Time: 05/15/22  6:29 AM  Result Value Ref Range   WBC 8.7 4.0 - 10.5 K/uL   RBC 5.39 4.22 - 5.81 MIL/uL   Hemoglobin 15.9 13.0 - 17.0 g/dL   HCT 46.0 39.0 - 52.0 %   MCV 85.3 80.0 - 100.0 fL   MCH 29.5 26.0 - 34.0 pg   MCHC 34.6 30.0 - 36.0 g/dL   RDW 12.0 11.5 - 15.5 %   Platelets 200 150 - 400 K/uL   nRBC 0.0 0.0 - 0.2 %   Neutrophils  Relative % 72 %   Neutro Abs 6.3 1.7 - 7.7 K/uL   Lymphocytes Relative 16 %   Lymphs Abs 1.4 0.7 - 4.0 K/uL   Monocytes Relative 7 %   Monocytes Absolute 0.6 0.1 - 1.0 K/uL   Eosinophils Relative 3 %   Eosinophils Absolute 0.3 0.0 - 0.5 K/uL   Basophils Relative 1 %   Basophils Absolute 0.1 0.0 - 0.1 K/uL   Immature Granulocytes 1 %   Abs Immature Granulocytes 0.05 0.00 - 0.07 K/uL    Comment: Performed at [facility], [place] [address] [place], [place] [id number]  Basic metabolic panel     Status: Abnormal   Collection Time: 05/15/22  6:29 AM  Result Value Ref Range   Sodium 137 135 - 145 mmol/L   Potassium 3.6 3.5 - 5.1 mmol/L   Chloride 104 98 -  111 mmol/L   CO2 24 22 - 32 mmol/L   Glucose, Bld 157 (H) 70 - 99 mg/dL    Comment: Glucose reference range applies only to samples taken after fasting for at least 8 hours.   BUN 15 6 - 20 mg/dL   Creatinine, Ser 0.81 0.61 - 1.24 mg/dL   Calcium 9.2 8.9 - 10.3 mg/dL   GFR, Estimated >60 >60 mL/min    Comment: (NOTE) Calculated using the CKD-EPI Creatinine Equation (2021)    Anion gap 9 5 - 15    Comment: Performed at [facility], [place] [address], [place], [place] [address]    No results found.  ROS - all of the below systems have been reviewed with the patient and positives are indicated with bold text General: chills, fever or night sweats Eyes: blurry vision or double vision ENT: epistaxis or sore throat Allergy/Immunology: itchy/watery eyes or nasal congestion Hematologic/Lymphatic: bleeding problems, blood clots or swollen lymph nodes Endocrine: temperature intolerance or unexpected weight changes Breast: new or changing breast lumps or nipple discharge Resp: cough, shortness of breath, or wheezing CV: chest pain or dyspnea on exertion GI: as per HPI GU: dysuria, trouble voiding, or hematuria MSK: joint pain or joint stiffness Neuro: TIA or stroke symptoms Derm: pruritus and skin lesion changes Psych: anxiety and depression  PE Blood pressure (!) 137/101, pulse 88, temperature 98.1 F (36.7 C), temperature source Oral, resp. rate 17, height 5' 9"$  (1.753 m), weight 102 kg, SpO2 97 %. Constitutional: NAD; conversant Eyes: Moist conjunctiva Neck: Trachea midline; no thyromegaly Lungs: Normal respiratory effort CV: RRR MSK: Normal range of motion of extremities Psychiatric: Appropriate affect  Results for orders placed or performed during the hospital encounter of 05/15/22 (from the past 48 hour(s))  CBC with Differential/Platelet     Status: None   Collection Time: 05/15/22  6:29 AM  Result Value Ref Range   WBC 8.7 4.0 - 10.5 K/uL   RBC 5.39  4.22 - 5.81 MIL/uL   Hemoglobin 15.9 13.0 - 17.0 g/dL   HCT 46.0 39.0 - 52.0 %   MCV 85.3 80.0 - 100.0 fL   MCH 29.5 26.0 - 34.0 pg   MCHC 34.6 30.0 - 36.0 g/dL   RDW 12.0 11.5 - 15.5 %   Platelets 200 150 - 400 K/uL   nRBC 0.0 0.0 - 0.2 %   Neutrophils Relative % 72 %   Neutro Abs 6.3 1.7 - 7.7 K/uL   Lymphocytes Relative 16 %   Lymphs Abs 1.4 0.7 - 4.0 K/uL   Monocytes Relative 7 %   Monocytes  Absolute 0.6 0.1 - 1.0 K/uL   Eosinophils Relative 3 %   Eosinophils Absolute 0.3 0.0 - 0.5 K/uL   Basophils Relative 1 %   Basophils Absolute 0.1 0.0 - 0.1 K/uL   Immature Granulocytes 1 %   Abs Immature Granulocytes 0.05 0.00 - 0.07 K/uL    Comment: Performed at [facility], [place] [address], [place], [place] [id number]  Basic metabolic panel     Status: Abnormal   Collection Time: 05/15/22  6:29 AM  Result Value Ref Range   Sodium 137 135 - 145 mmol/L   Potassium 3.6 3.5 - 5.1 mmol/L   Chloride 104 98 - 111 mmol/L   CO2 24 22 - 32 mmol/L   Glucose, Bld 157 (H) 70 - 99 mg/dL    Comment: Glucose reference range applies only to samples taken after fasting for at least 8 hours.   BUN 15 6 - 20 mg/dL   Creatinine, Ser 0.81 0.61 - 1.24 mg/dL   Calcium 9.2 8.9 - 10.3 mg/dL   GFR, Estimated >60 >60 mL/min    Comment: (NOTE) Calculated using the CKD-EPI Creatinine Equation (2021)    Anion gap 9 5 - 15    Comment: Performed at [facility], [place] [address], [place], [place] [address]    No results found.   A/P: Katron Scherzer is an 47 y.o. male here for surgery re: recent perianal abscess - now with suspected perianal abscess and fistula  -He has an additional perianal abscess today that is more medial to his prior I&D last week. There is a tense fluctuant bullae at this location. We discussed in office incision/drainage of this today.  -After informed consent is obtained, the fluctuant area is anesthetized with Xylocaine with epinephrine.  After ensuring appropriate anesthesia, this area was incised in a cruciate manner. Approximately 1 to 2 cc of sanguinous purulent fluid was drained. Hemostasis verified. A dressing consisting of 4 x 4 was placed in the perianal region. He tolerated the procedure well. No specimens/cultures were obtained.  -The anatomy and physiology of the anal canal was discussed with the patient with associated pictures. The pathophysiology of anal abscess and fistula was discussed at length with associated pictures and illustrations. -We have reviewed options going forward including further observation vs surgery - anorectal exam under anesthesia, possible incision/drainage of perianal abscess, possible seton vs fistulotomy -The planned procedures, material risks (including, but not limited to, pain, bleeding, infection, scarring, need for blood transfusion, damage to anal sphincter, incontinence of gas and/or stool, need for additional procedures, anal stenosis, rare cases of pelvic sepsis which in severe cases may require things like a colostomy, recurrence, pneumonia, heart attack, stroke, death) benefits and alternatives to surgery were discussed at length. I noted a good probability that the procedure would help improve their symptoms. The patient's questions were answered to his satisfaction, his voiced understanding and elected to proceed with surgery. Additionally, we discussed typical postoperative expectations and the recovery process.   Nadeen Landau, [facility], [place]

## 2022-05-16 ENCOUNTER — Encounter (HOSPITAL_BASED_OUTPATIENT_CLINIC_OR_DEPARTMENT_OTHER): Payer: Self-pay | Admitting: Surgery

## 2022-08-07 ENCOUNTER — Encounter (HOSPITAL_BASED_OUTPATIENT_CLINIC_OR_DEPARTMENT_OTHER): Payer: Self-pay | Admitting: Surgery

## 2022-08-07 ENCOUNTER — Other Ambulatory Visit: Payer: Self-pay

## 2022-08-07 NOTE — Progress Notes (Signed)
Spoke w/ via phone for pre-op interview---pt with spanish interpreter number [phone] Lab needs dos----    none           Lab results------none COVID test -----patient states asymptomatic no test needed Arrive at -------530 am 08-21-2022 NPO after MN NO Solid Food.  Clear liquids from MN until---430 am Med rec completed Medications to take morning of surgery -----none Diabetic medication -----n/a Patient instructed no nail polish to be worn day of surgery Patient instructed to bring photo id and insurance card day of surgery Patient aware to have Driver (ride ) / caregiver   wife nancy  for 24 hours after surgery  Patient Special Instructions -----none Pre-Op special Instructions -----none Patient verbalized understanding of instructions that were given at this phone interview. Patient denies shortness of breath, chest pain, fever, cough at this phone interview.  Spanish interpreter requested for 08-21-2022 surgery, copy of email on chart

## 2022-08-20 ENCOUNTER — Ambulatory Visit: Payer: Self-pay | Admitting: Surgery

## 2022-08-21 ENCOUNTER — Encounter (HOSPITAL_BASED_OUTPATIENT_CLINIC_OR_DEPARTMENT_OTHER)
Admission: RE | Disposition: A | Discharge status: Home / Self Care | Payer: Self-pay | Source: Home / Self Care | Attending: Surgery

## 2022-08-21 ENCOUNTER — Other Ambulatory Visit: Payer: Self-pay

## 2022-08-21 ENCOUNTER — Ambulatory Visit (HOSPITAL_BASED_OUTPATIENT_CLINIC_OR_DEPARTMENT_OTHER): Payer: Self-pay | Admitting: Certified Registered"

## 2022-08-21 ENCOUNTER — Encounter (HOSPITAL_BASED_OUTPATIENT_CLINIC_OR_DEPARTMENT_OTHER): Payer: Self-pay | Admitting: Surgery

## 2022-08-21 ENCOUNTER — Ambulatory Visit (HOSPITAL_BASED_OUTPATIENT_CLINIC_OR_DEPARTMENT_OTHER)
Admission: RE | Admit: 2022-08-21 | Discharge: 2022-08-21 | Disposition: A | Discharge status: Home / Self Care | Payer: Self-pay | Attending: Surgery | Admitting: Surgery

## 2022-08-21 DIAGNOSIS — K603 Anal fistula: Secondary | ICD-10-CM | POA: Insufficient documentation

## 2022-08-21 DIAGNOSIS — Z87891 Personal history of nicotine dependence: Secondary | ICD-10-CM | POA: Insufficient documentation

## 2022-08-21 DIAGNOSIS — Z01818 Encounter for other preprocedural examination: Secondary | ICD-10-CM

## 2022-08-21 DIAGNOSIS — E669 Obesity, unspecified: Secondary | ICD-10-CM

## 2022-08-21 DIAGNOSIS — Z6833 Body mass index (BMI) 33.0-33.9, adult: Secondary | ICD-10-CM

## 2022-08-21 HISTORY — PX: LIGATION OF INTERNAL FISTULA TRACT: SHX6551

## 2022-08-21 HISTORY — PX: RECTAL EXAM UNDER ANESTHESIA: SHX6399

## 2022-08-21 SURGERY — LIGATION, INTERNAL FISTULA TRACT
Anesthesia: General | Site: Rectum

## 2022-08-21 MED ORDER — FENTANYL CITRATE (PF) 100 MCG/2ML IJ SOLN
INTRAMUSCULAR | Status: AC
  Filled 2022-08-21: qty 2

## 2022-08-21 MED ORDER — AMISULPRIDE (ANTIEMETIC) 5 MG/2ML IV SOLN: 10.0000 mg | Freq: Once | INTRAVENOUS | Status: DC | PRN

## 2022-08-21 MED ORDER — OXYCODONE HCL 5 MG PO TABS: 5.0000 mg | ORAL_TABLET | Freq: Once | ORAL | Status: DC | PRN

## 2022-08-21 MED ORDER — MIDAZOLAM HCL 2 MG/2ML IJ SOLN
INTRAMUSCULAR | Status: AC
  Filled 2022-08-21: qty 2

## 2022-08-21 MED ORDER — BUPIVACAINE LIPOSOME 1.3 % IJ SUSP
INTRAMUSCULAR | Status: DC | PRN
  Administered 2022-08-21: 20 mL

## 2022-08-21 MED ORDER — SUGAMMADEX SODIUM 200 MG/2ML IV SOLN
INTRAVENOUS | Status: DC | PRN
  Administered 2022-08-21: 200 mg via INTRAVENOUS

## 2022-08-21 MED ORDER — HYDROMORPHONE HCL 1 MG/ML IJ SOLN: 0.2500 mg | INTRAMUSCULAR | Status: DC | PRN

## 2022-08-21 MED ORDER — DEXAMETHASONE SODIUM PHOSPHATE 10 MG/ML IJ SOLN
INTRAMUSCULAR | Status: DC | PRN
  Administered 2022-08-21: 10 mg via INTRAVENOUS

## 2022-08-21 MED ORDER — BUPIVACAINE HCL (PF) 0.25 % IJ SOLN
INTRAMUSCULAR | Status: DC | PRN
  Administered 2022-08-21: 30 mL

## 2022-08-21 MED ORDER — PROPOFOL 10 MG/ML IV BOLUS
INTRAVENOUS | Status: DC | PRN
  Administered 2022-08-21: 200 mg via INTRAVENOUS

## 2022-08-21 MED ORDER — SODIUM CHLORIDE 0.9 % IV SOLN
INTRAVENOUS | Status: AC
  Filled 2022-08-21: qty 2

## 2022-08-21 MED ORDER — PROMETHAZINE HCL 25 MG/ML IJ SOLN: 6.2500 mg | INTRAMUSCULAR | Status: DC | PRN

## 2022-08-21 MED ORDER — FLEET ENEMA 7-19 GM/118ML RE ENEM: 1.0000 | ENEMA | Freq: Once | RECTAL | Status: DC

## 2022-08-21 MED ORDER — ONDANSETRON HCL 4 MG/2ML IJ SOLN
INTRAMUSCULAR | Status: AC
  Filled 2022-08-21: qty 2

## 2022-08-21 MED ORDER — ONDANSETRON HCL 4 MG/2ML IJ SOLN
INTRAMUSCULAR | Status: DC | PRN
  Administered 2022-08-21: 4 mg via INTRAVENOUS

## 2022-08-21 MED ORDER — LACTATED RINGERS IV SOLN: INTRAVENOUS | Status: DC

## 2022-08-21 MED ORDER — LIDOCAINE HCL (CARDIAC) PF 100 MG/5ML IV SOSY
PREFILLED_SYRINGE | INTRAVENOUS | Status: DC | PRN
  Administered 2022-08-21: 100 mg via INTRAVENOUS

## 2022-08-21 MED ORDER — LACTATED RINGERS IV SOLN: INTRAVENOUS | Status: DC | PRN

## 2022-08-21 MED ORDER — LIDOCAINE HCL (PF) 2 % IJ SOLN
INTRAMUSCULAR | Status: AC
  Filled 2022-08-21: qty 5

## 2022-08-21 MED ORDER — MIDAZOLAM HCL 5 MG/5ML IJ SOLN
INTRAMUSCULAR | Status: DC | PRN
  Administered 2022-08-21: 2 mg via INTRAVENOUS

## 2022-08-21 MED ORDER — FENTANYL CITRATE (PF) 100 MCG/2ML IJ SOLN
INTRAMUSCULAR | Status: DC | PRN
  Administered 2022-08-21: 25 ug via INTRAVENOUS
  Administered 2022-08-21 (×2): 50 ug via INTRAVENOUS

## 2022-08-21 MED ORDER — OXYCODONE HCL 5 MG/5ML PO SOLN: 5.0000 mg | Freq: Once | ORAL | Status: DC | PRN

## 2022-08-21 MED ORDER — MEPERIDINE HCL 25 MG/ML IJ SOLN: 6.2500 mg | INTRAMUSCULAR | Status: DC | PRN

## 2022-08-21 MED ORDER — PROPOFOL 10 MG/ML IV BOLUS
INTRAVENOUS | Status: AC
  Filled 2022-08-21: qty 20

## 2022-08-21 MED ORDER — SODIUM CHLORIDE 0.9 % IV SOLN
2.0000 g | INTRAVENOUS | Status: AC
  Administered 2022-08-21: 2 g via INTRAVENOUS

## 2022-08-21 MED ORDER — DEXAMETHASONE SODIUM PHOSPHATE 10 MG/ML IJ SOLN
INTRAMUSCULAR | Status: AC
  Filled 2022-08-21: qty 1

## 2022-08-21 MED ORDER — 0.9 % SODIUM CHLORIDE (POUR BTL) OPTIME
TOPICAL | Status: DC | PRN
  Administered 2022-08-21: 500 mL

## 2022-08-21 MED ORDER — BUPIVACAINE LIPOSOME 1.3 % IJ SUSP: 20.0000 mL | Freq: Once | INTRAMUSCULAR | Status: DC

## 2022-08-21 MED ORDER — ROCURONIUM BROMIDE 100 MG/10ML IV SOLN
INTRAVENOUS | Status: DC | PRN
  Administered 2022-08-21: 60 mg via INTRAVENOUS

## 2022-08-21 MED ORDER — ROCURONIUM BROMIDE 10 MG/ML (PF) SYRINGE
PREFILLED_SYRINGE | INTRAVENOUS | Status: AC
  Filled 2022-08-21: qty 10

## 2022-08-21 MED ORDER — ACETAMINOPHEN 500 MG PO TABS
1000.0000 mg | ORAL_TABLET | ORAL | Status: AC
  Administered 2022-08-21: 1000 mg via ORAL

## 2022-08-21 MED ORDER — ACETAMINOPHEN 500 MG PO TABS
ORAL_TABLET | ORAL | Status: AC
  Filled 2022-08-21: qty 2

## 2022-08-21 MED ORDER — TRAMADOL HCL 50 MG PO TABS: 50.0000 mg | ORAL_TABLET | Freq: Four times a day (QID) | ORAL | 0 refills | Status: AC | PRN

## 2022-08-21 MED ORDER — DEXMEDETOMIDINE HCL IN NACL 80 MCG/20ML IV SOLN
INTRAVENOUS | Status: DC | PRN
  Administered 2022-08-21: 12 ug via INTRAVENOUS

## 2022-08-21 SURGICAL SUPPLY — 76 items
APL SKNCLS STERI-STRIP NONHPOA (GAUZE/BANDAGES/DRESSINGS) ×1
BENZOIN TINCTURE PRP APPL 2/3 (GAUZE/BANDAGES/DRESSINGS) ×4 IMPLANT
BLADE EXTENDED COATED 6.5IN (ELECTRODE) IMPLANT
BLADE SURG 10 STRL SS (BLADE) IMPLANT
BLADE SURG 15 STRL LF DISP TIS (BLADE) ×2 IMPLANT
BLADE SURG 15 STRL SS (BLADE) ×1
BRIEF MESH DISP LRG (UNDERPADS AND DIAPERS) ×2 IMPLANT
COVER BACK TABLE 60X90IN (DRAPES) ×2 IMPLANT
COVER MAYO STAND STRL (DRAPES) ×2 IMPLANT
DRAPE LAPAROTOMY 100X72 PEDS (DRAPES) ×2 IMPLANT
DRAPE UTILITY XL STRL (DRAPES) ×2 IMPLANT
ELECT NDL BLADE 2-5/6 (NEEDLE) ×2 IMPLANT
ELECT NEEDLE BLADE 2-5/6 (NEEDLE) ×1 IMPLANT
GAUZE 4X4 16PLY ~~LOC~~+RFID DBL (SPONGE) ×2 IMPLANT
GAUZE PAD ABD 8X10 STRL (GAUZE/BANDAGES/DRESSINGS) ×2 IMPLANT
GAUZE SPONGE 4X4 12PLY STRL (GAUZE/BANDAGES/DRESSINGS) ×2 IMPLANT
GLOVE BIO SURGEON STRL SZ7 (GLOVE) IMPLANT
GLOVE BIO SURGEON STRL SZ7.5 (GLOVE) ×2 IMPLANT
GLOVE BIOGEL PI IND STRL 6.5 (GLOVE) IMPLANT
GLOVE BIOGEL PI IND STRL 7.0 (GLOVE) IMPLANT
GLOVE INDICATOR 8.0 STRL GRN (GLOVE) ×2 IMPLANT
GLOVE SURG SYN 6.5 ES PF (GLOVE) ×1 IMPLANT
GLOVE SURG SYN 6.5 PF PI (GLOVE) IMPLANT
GOWN SRG XL 47XLVL 3 REINF (GOWN DISPOSABLE) IMPLANT
GOWN STRL REIN XL LVL3 (GOWN DISPOSABLE) ×2
GOWN STRL REUS W/TWL LRG LVL3 (GOWN DISPOSABLE) IMPLANT
GOWN STRL REUS W/TWL XL LVL3 (GOWN DISPOSABLE) ×2 IMPLANT
HYDROGEN PEROXIDE 16OZ (MISCELLANEOUS) IMPLANT
IV CATH 14GX2 1/4 (CATHETERS) IMPLANT
IV CATH 18G SAFETY (IV SOLUTION) IMPLANT
KIT TURNOVER CYSTO (KITS) ×2 IMPLANT
LEGGING LITHOTOMY PAIR STRL (DRAPES) IMPLANT
LOOP VASCLR MAXI BLUE 18IN ST (MISCELLANEOUS) IMPLANT
LOOP VASCULAR MAXI 18 BLUE (MISCELLANEOUS)
LOOPS VASCLR MAXI BLUE 18IN ST (MISCELLANEOUS) IMPLANT
NDL HYPO 22X1.5 SAFETY MO (MISCELLANEOUS) ×2 IMPLANT
NDL HYPO 25X1 1.5 SAFETY (NEEDLE) IMPLANT
NDL SAFETY ECLIP 18X1.5 (MISCELLANEOUS) IMPLANT
NEEDLE HYPO 22X1.5 SAFETY MO (MISCELLANEOUS) ×1 IMPLANT
NEEDLE HYPO 25X1 1.5 SAFETY (NEEDLE) IMPLANT
NS IRRIG 500ML POUR BTL (IV SOLUTION) ×2 IMPLANT
PACK BASIN DAY SURGERY FS (CUSTOM PROCEDURE TRAY) ×2 IMPLANT
PENCIL SMOKE EVACUATOR (MISCELLANEOUS) ×2 IMPLANT
RETRACTOR RING URO 16.6X16.6 (MISCELLANEOUS) ×2 IMPLANT
RETRACTOR STAY HOOK 5MM (MISCELLANEOUS) ×2 IMPLANT
SLEEVE SCD COMPRESS KNEE MED (STOCKING) ×2 IMPLANT
SPIKE FLUID TRANSFER (MISCELLANEOUS) ×2 IMPLANT
SPONGE HEMORRHOID 8X3CM (HEMOSTASIS) IMPLANT
SPONGE SURGIFOAM ABS GEL 12-7 (HEMOSTASIS) IMPLANT
SUCTION FRAZIER HANDLE 10FR (MISCELLANEOUS) ×1
SUCTION TUBE FRAZIER 10FR DISP (MISCELLANEOUS) ×2 IMPLANT
SUT CHROMIC 2 0 SH (SUTURE) IMPLANT
SUT CHROMIC 3 0 SH 27 (SUTURE) IMPLANT
SUT MNCRL AB 4-0 PS2 18 (SUTURE) IMPLANT
SUT SILK 0 TIES 10X30 (SUTURE) ×2 IMPLANT
SUT SILK 2 0 (SUTURE)
SUT SILK 2 0 SH (SUTURE) ×2 IMPLANT
SUT SILK 2-0 18XBRD TIE 12 (SUTURE) IMPLANT
SUT VIC AB 2-0 SH 27 (SUTURE)
SUT VIC AB 2-0 SH 27XBRD (SUTURE) IMPLANT
SUT VIC AB 2-0 UR6 27 (SUTURE) IMPLANT
SUT VIC AB 3-0 SH 18 (SUTURE) IMPLANT
SUT VIC AB 3-0 SH 27 (SUTURE) ×2
SUT VIC AB 3-0 SH 27X BRD (SUTURE) IMPLANT
SUT VIC AB 3-0 SH 27XBRD (SUTURE) IMPLANT
SUT VICRYL 0 UR6 27IN ABS (SUTURE) IMPLANT
SUT VICRYL 2 0 18  UND BR (SUTURE)
SUT VICRYL 2 0 18 UND BR (SUTURE) IMPLANT
SYR 20ML LL LF (SYRINGE) IMPLANT
SYR BULB IRRIG 60ML STRL (SYRINGE) ×2 IMPLANT
SYR CONTROL 10ML LL (SYRINGE) ×2 IMPLANT
TOWEL OR 17X24 6PK STRL BLUE (TOWEL DISPOSABLE) ×2 IMPLANT
TRAY DSU PREP LF (CUSTOM PROCEDURE TRAY) ×2 IMPLANT
TUBE CONNECTING 12X1/4 (SUCTIONS) ×2 IMPLANT
VASCULAR TIE MAXI BLUE 18IN ST (MISCELLANEOUS)
YANKAUER SUCT BULB TIP NO VENT (SUCTIONS) ×2 IMPLANT

## 2022-08-21 NOTE — Anesthesia Procedure Notes (Signed)
Procedure Name: Intubation Date/Time: 08/21/2022 7:40 AM  Performed by: Cleda Clarks, CRNAPre-anesthesia Checklist: Patient identified, Emergency Drugs available, Suction available and Patient being monitored Patient Re-evaluated:Patient Re-evaluated prior to induction Oxygen Delivery Method: Circle system utilized Preoxygenation: Pre-oxygenation with 100% oxygen Induction Type: IV induction Ventilation: Mask ventilation without difficulty Laryngoscope Size: Mac and 4 Grade View: Grade I Tube type: Oral Number of attempts: 1 Airway Equipment and Method: Stylet and Oral airway Placement Confirmation: ETT inserted through vocal cords under direct vision, positive ETCO2 and breath sounds checked- equal and bilateral Tube secured with: Tape Dental Injury: Teeth and Oropharynx as per pre-operative assessment

## 2022-08-21 NOTE — Discharge Instructions (Addendum)
ANORECTAL SURGERY: POST OP INSTRUCTIONS We were able to successfully tie off the fistula tract. The area where your seton was located was opened a little more to facilitate drainage while your wounds heal. No packing is necessary. Keep a clean gauze in the underwear to protect your clothing. You may bathe normally with soap/water over your wounds.   DIET: Follow a light bland diet the first 24 hours after arrival home, such as soup, liquids, crackers, etc.  Be sure to include lots of fluids daily.  Avoid fast food or heavy meals as your are more likely to get nauseated.  Eat a low fat diet the next few days after surgery.   Some bleeding with bowel movements is expected for the first couple of days but this should stop in between bowel movements  Take your usually prescribed home medications unless otherwise directed. No foreign bodies per rectum for the next 3 months (enemas, etc)  PAIN CONTROL: It is helpful to take an over-the-counter pain medication regularly for the first few days/weeks.  Choose from the following that works best for you: Ibuprofen (Advil, etc) Three 200mg  tabs every 6 hours as needed. Acetaminophen (Tylenol, etc) 500-650mg  every 6 hours as needed NOTE: You may take both of these medications together - most patients find it most helpful when alternating between the two (i.e. Ibuprofen at 6am, tylenol at 9am, ibuprofen at 12pm ..Marland Kitchen) A  prescription for pain medication may have been prescribed for you at discharge.  Take your pain medication as prescribed.  If you are having problems/concerns with the prescription medicine, please call us for further advice.  Avoid getting constipated.  Between the surgery and the pain medications, it is common to experience some constipation.  Increasing fluid intake (64oz of water per day) and taking a fiber supplement (such as Metamucil, Citrucel, FiberCon) 1-2 times a day regularly will usually help prevent this problem from occurring.  Take  Miralax (over the counter) 1-2x/day while taking a narcotic pain medication. If no bowel movement after 48hours, you may additionally take a laxative like a bottle of Milk of Magnesia which can be purchased over the counter. Avoid enemas.   Watch out for diarrhea.  If you have many loose bowel movements, simplify your diet to bland foods.  Stop any stool softeners and decrease your fiber supplement. If this worsens or does not improve, please call us.  Wash / shower every day.  If you were discharged with a dressing, you may remove this the day after your surgery. You may shower normally, getting soap/water on your wound, particularly after bowel movements.  Soaking in a warm bath filled a couple inches ("Sitz bath") is a great way to clean the area after a bowel movement and many patients find it is a way to soothe the area.  ACTIVITIES as tolerated:   You may resume regular (light) daily activities beginning the next day--such as daily self-care, walking, climbing stairs--gradually increasing activities as tolerated.  If you can walk 30 minutes without difficulty, it is safe to try more intense activity such as jogging, treadmill, bicycling, low-impact aerobics, etc. Refrain from any heavy lifting or straining for the first 2 weeks after your procedure, particularly if your surgery was for hemorrhoids. Avoid activities that make your pain worse You may drive when you are no longer taking prescription pain medication, you can comfortably wear a seatbelt, and you can safely maneuver your car and apply brakes.  FOLLOW UP in our office Please call [facility]  at [phone] to set up an appointment to see your surgeon in the office for a follow-up appointment approximately 2 weeks after your surgery. Make sure that you call for this appointment the day you arrive home to insure a convenient appointment time.  9. If you have disability or family leave forms that need to be completed, you may have them  completed by your primary care physician's office; for return to work instructions, please ask our office staff and they will be happy to assist you in obtaining this documentation   When to call us [phone]: Poor pain control Reactions / problems with new medications (rash/itching, etc)  Fever over 101.5 F (38.5 C) Inability to urinate Nausea/vomiting Worsening swelling or bruising Continued bleeding from incision. Increased pain, redness, or drainage from the incision  The clinic staff is available to answer your questions during regular business hours (8:30am-5pm).  Please don't hesitate to call and ask to speak to one of our nurses for clinical concerns.   A surgeon from [facility] is always on call at the hospitals   If you have a medical emergency, go to the nearest emergency room or call 911.   [facility] [address], [place], [place]  [address] MAIN: [phone] FAX: [phone] [email or web address]    No acetaminophen/Tylenol until after 1245 pm today if needed.   If you return to the hospital for any reason within 96 hours following the administration of EXPAREL, it is important for health care providers to know that you have received this anesthetic. A teal colored band has been placed on your arm with the date, time and amount of EXPAREL you have received in order to alert and inform your health care providers. Please leave this armband in place for the full 96 hours following administration, and then you may remove the band.  (May remove bracelet Monday June 3rd)

## 2022-08-21 NOTE — Anesthesia Preprocedure Evaluation (Signed)
Anesthesia Evaluation  Patient identified by MRN, date of birth, ID band Patient awake    Reviewed: Allergy & Precautions, H&P , NPO status , Patient's Chart, lab work & pertinent test results  Airway Mallampati: II  TM Distance: >3 FB Neck ROM: Full    Dental no notable dental hx.    Pulmonary neg pulmonary ROS, former smoker   Pulmonary exam normal breath sounds clear to auscultation       Cardiovascular negative cardio ROS Normal cardiovascular exam Rhythm:Regular Rate:Normal     Neuro/Psych negative neurological ROS  negative psych ROS   GI/Hepatic negative GI ROS, Neg liver ROS,,,  Endo/Other  negative endocrine ROS    Renal/GU negative Renal ROS  negative genitourinary   Musculoskeletal negative musculoskeletal ROS (+)    Abdominal  (+) + obese  Peds negative pediatric ROS (+)  Hematology negative hematology ROS (+)   Anesthesia Other Findings   Reproductive/Obstetrics negative OB ROS                             Anesthesia Physical Anesthesia Plan  ASA: 2  Anesthesia Plan: General   Post-op Pain Management: Tylenol PO (pre-op)*   Induction: Intravenous  PONV Risk Score and Plan: 2 and Ondansetron, Treatment may vary due to age or medical condition and Midazolam  Airway Management Planned: Oral ETT  Additional Equipment:   Intra-op Plan:   Post-operative Plan: Extubation in OR  Informed Consent: I have reviewed the patients History and Physical, chart, labs and discussed the procedure including the risks, benefits and alternatives for the proposed anesthesia with the patient or authorized representative who has indicated his/her understanding and acceptance.     Dental advisory given  Plan Discussed with: CRNA and Surgeon  Anesthesia Plan Comments:        Anesthesia Quick Evaluation

## 2022-08-21 NOTE — Op Note (Signed)
08/21/2022  10:31 AM  PATIENT:  Travis Ryan  47 y.o. male  Patient Care Team: Georgina Quint, MD as PCP - General (Internal Medicine) Patient, No Pcp Per (General Practice)  PRE-OPERATIVE DIAGNOSIS: Transsphincteric anal fistula  POST-OPERATIVE DIAGNOSIS: Transsphincteric anal fistula  PROCEDURE:   Surgical treatment of transsphincteric anal fistula - Ligation of Intersphincteric Fistulous Tract (LIFT) Anorectal exam under anesthesia  SURGEON:  Surgeon(s): Andria Meuse, MD  ANESTHESIA:   local and general  SPECIMEN:  No Specimen  DISPOSITION OF SPECIMEN:  N/A  COUNTS:  Sponge, needle, and instrument counts were reported correct x2 at conclusion.  EBL: 10 mL  Drains: None  PLAN OF CARE: Discharge to home after PACU  PATIENT DISPOSITION:  PACU - hemodynamically stable.  OR FINDINGS: Left anterior transsphincteric anal fistula.  Normal anal canal otherwise.  Indwelling seton.  No large ischial rectal fossa abscesses or active infection evident.  Ligation of intersphincteric fistula tract (LIFT) carried out uneventfully.  DESCRIPTION: The patient was identified in the preoperative holding area and taken to the OR. SCDs were applied.  He then underwent general endotracheal anesthesia without difficulty. The patient was then rolled onto the OR table in the prone jackknife position. Pressure points were then evaluated and padded. Benzoin was applied to the buttocks and they were gently taped apart.  He was then prepped and draped in usual sterile fashion.  A surgical timeout was performed indicating the correct patient, procedure, and positioning.  A perianal block was then created using a dilute mixture of 0.25% Marcaine with epinephrine and Exparel.  After ascertaining an appropriate level of anesthesia had been achieved, a well lubricated digital rectal exam was performed. This demonstrated no palpable abnormality.  Indwelling blue vessel loop seton in the  left anterior position.  There is no large or palpable issue rectal fossa abscesses.  The skin is fibrotic at the external opening with hypergranulation tissue consistent with his known fistula.  This is a transsphincteric type fistula..  A Hill-Ferguson anoscope was into the anal canal and circumferential inspection demonstrated healthy appearing anoderm.  The internal opening is clean in appearance.  This exits at the level of the dentate line.  There is palpable internal and external sphincter muscle overlying it.   The seton is exchanged for a semirigid fistula probe.  The seton was discarded.  The sphincter apparatus is palpated.  The intersphincteric groove was identified.  A curvilinear incision was created in the intersphincteric groove.  Were able to maintain this tract down to the level of the fistulous tract.  No internal or external sphincter muscle was divided during this portion of the procedure.  This dissection is done with a fine-tipped hemostat.  The tract is then circumferentially dissected.  The fistula tract is clean in appearance.  After circumferentially dissecting it, it is then divided between clamps.  Both the internal and external side of the fistula tract is then suture-ligated using 2-0 Vicryl sutures.  A crypt hook is carefully introduced into the internal opening and we are able to identify that we have successfully excluded the fistula tract.  The external opening is then cannulated with our semirigid fistula probe carefully and were also able to ensure that this tract has been completely ligated.  The intersphincteric plane is irrigated and hemostasis verified.  The LIFT incision is closed in layers using 3-0 Vicryl sutures.  The external opening is then excised using electrocautery and hypergranulation tissue is fulgurated.  We did this to help  facilitate healing and allow this tract to close before the skin fully closes.  All sponge needle and instrument counts are reported  correct.  Additional local anesthetic is infiltrated at the surgical sites.  The buttocks are untaped.  A dressing consisting of 4 x 4's, ABD, and mesh underwear is placed.  He was rolled back onto a stretcher, awakened from anesthesia, extubated, and transported to recovery in satisfactory condition.  DISPOSITION: PACU in satisfactory condition.

## 2022-08-21 NOTE — Transfer of Care (Signed)
Immediate Anesthesia Transfer of Care Note  Patient: Travis Ryan  Procedure(s) Performed: LIGATION OF INTERSPHINCTERIC FISTULA TRACT (Rectum) ANORECTAL EXAM UNDER ANESTHESIA (Rectum)  Patient Location: PACU  Anesthesia Type:General  Level of Consciousness: awake and drowsy  Airway & Oxygen Therapy: Patient Spontanous Breathing and Patient connected to face mask oxygen  Post-op Assessment: Report given to RN and Post -op Vital signs reviewed and stable  Post vital signs: Reviewed and stable  Last Vitals:  Vitals Value Taken Time  BP 124/85 08/21/22 0853  Temp    Pulse 79 08/21/22 0854  Resp 20 08/21/22 0854  SpO2 98 % 08/21/22 0854  Vitals shown include unvalidated device data.  Last Pain:  Vitals:   08/21/22 4098  TempSrc: Oral         Complications: No notable events documented.

## 2022-08-21 NOTE — Anesthesia Postprocedure Evaluation (Signed)
Anesthesia Post Note  Patient: Travis Ryan  Procedure(s) Performed: LIGATION OF INTERSPHINCTERIC FISTULA TRACT (Rectum) ANORECTAL EXAM UNDER ANESTHESIA (Rectum)     Patient location during evaluation: PACU Anesthesia Type: General Level of consciousness: awake and alert Pain management: pain level controlled Vital Signs Assessment: post-procedure vital signs reviewed and stable Respiratory status: spontaneous breathing, nonlabored ventilation and respiratory function stable Cardiovascular status: blood pressure returned to baseline and stable Postop Assessment: no apparent nausea or vomiting Anesthetic complications: no   No notable events documented.  Last Vitals:  Vitals:   08/21/22 0916 08/21/22 0930  BP:  127/85  Pulse:  80  Resp:  16  Temp:    SpO2: 96% 95%    Last Pain:  Vitals:   08/21/22 1000  TempSrc:   PainSc: 4                  Lowella Curb

## 2022-08-21 NOTE — H&P (Signed)
CC: Here today for surgery  HPI: Travis Ryan is an 47 y.o. male with history of seasonal allergies and prior perianal abscesses, whom is seen in the office today for evaluation. He was seen last week in our urgent office with Puja.  First developed some discomfort/perianal pain back in October, 2023. He was found to have a left perianal abscess and suspected anal fissure. He was given Augmentin, nifedipine cream, and Anusol suppositories. He was subsequently referred to see GI-Dr. Tomasa Rand and colorectal surgery but did not make an appointment at that time due to lack of insurance. He had persistent to some degree perianal pain in the interim. He notes some minor tight pressure in the perianal area. Some yellowish drainage occasionally as well. Will wear pads to control the amount of drainage she has had in the past.  Denies ever having had a colonoscopy. 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 He underwent incision/drainage in the office. He was subsequently scheduled to see [place] for follow-up. He denies any history of any prior anal abscesses, swelling, or pain prior to October, 2023.  Overall since having an I&D last week, reports all of his symptoms have improved. Currently no significant perianal pain. Scant drainage. No fever or chills.  He is fluent in [place] and was offered but declined medical interpreter.  PMH: Seasonal allergies  PSH: I&D of perianal abscess x1 as above  FHx: Denies any known family history of colorectal, breast, endometrial or ovarian cancer  Social Hx: Drinks ~1 beer per day. Denies use of tobacco/illicit drug. 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   He denies any changes in health or health history since we met in the office. No new medications/allergies. He states he is ready for surgery today.  Past Medical History:  Diagnosis Date   Anal fistula    Perirectal abscess     Seasonal allergies     Past Surgical History:  Procedure Laterality Date   FISTULOTOMY N/A 05/15/2022   Procedure: CONTROL OF ANAL FISTULA/INTERROGATION WITH Partial FISTULOTOMY and SETON, POSSIBLE INCISION/DRAINAGE OF PERIRECTAL ABSCESS;  Surgeon: Andria Meuse, MD;  Location: [facility];  Service: General;  Laterality: N/A;   UMBILICAL HERNIA REPAIR  2018    Family History  Problem Relation Age of Onset   Cancer Maternal Grandmother    Early death Maternal Grandfather    Cancer Paternal Grandmother    Early death Paternal Grandfather    Stomach cancer Neg Hx    Esophageal cancer Neg Hx    Colon cancer Neg Hx     Social:  reports that he quit smoking about 14 years ago. His smoking use included cigarettes. He has never used smokeless tobacco. He reports current alcohol use of about 3.0 standard drinks of alcohol per week. He reports that he does not use drugs.  Allergies: No Known Allergies  Medications: I have reviewed the patient's current medications.  No results found for this or any previous visit (from the past 48 hour(s)).  No results found.   PE Blood pressure (!) 147/104, pulse 77, temperature 97.8 F (36.6 C), temperature source Oral, resp. rate 17, height 5\' 9"  (1.753 m), weight 101.6 kg, SpO2 98 %. Constitutional: NAD; conversant Eyes: Moist conjunctiva; no lid lag; anicteric Lungs: Normal respiratory effort CV: RRR Psychiatric: Appropriate affect  No results found for this or any previous visit (from the past 48 hour(s)).  No results found.  A/P: [name] is an 47 y.o. male here for surgery of recent perianal abscess - now with suspected perianal abscess and fistula  -He has an additional perianal abscess today that is more medial to his prior I&D last week. There is a tense fluctuant bullae at this location. We discussed in office incision/drainage of this today.  -The anatomy and physiology of the anal canal was  discussed with the patient with associated pictures. The pathophysiology of anal abscess and fistula was discussed at length with associated pictures and illustrations. -We have reviewed options going forward including further observation vs surgery - anorectal exam under anesthesia, possible incision/drainage of perianal abscess, possible seton vs fistulotomy -The planned procedures, material risks (including, but not limited to, pain, bleeding, infection, scarring, need for blood transfusion, damage to anal sphincter, incontinence of gas and/or stool, need for additional procedures, anal stenosis, rare cases of pelvic sepsis which in severe cases may require things like a colostomy, recurrence, pneumonia, heart attack, stroke, death) benefits and alternatives to surgery were discussed at length. I noted a good probability that the procedure would help improve their symptoms. The patient's questions were answered to his satisfaction, his voiced understanding and elected to proceed with surgery. Additionally, we discussed typical postoperative expectations and the recovery process.   Marin Olp, MD [facility], [facility]

## 2022-08-25 ENCOUNTER — Encounter (HOSPITAL_BASED_OUTPATIENT_CLINIC_OR_DEPARTMENT_OTHER): Payer: Self-pay | Admitting: Surgery

## 2022-12-26 ENCOUNTER — Telehealth: Payer: Self-pay | Admitting: Gastroenterology

## 2022-12-26 NOTE — Telephone Encounter (Signed)
Inbound call from patient and patient's sister requesting to know if patient is able to schedule colonoscopy. Stated patient is schedule to have surgery with Dr. Cliffton Asters on 10/16. Patient requesting a call to discuss further. Please advise, thank you.

## 2022-12-26 NOTE — Telephone Encounter (Signed)
Pt scheduled to have surgery with Dr. Cliffton Asters 10/16. Pt calling requesting to have colonoscopy scheduled prior to 10/16. Let him know Dr. Tomasa Rand did not have anything available prior to that date but would send him the message. I checked all  other providers and could not find anything prior to that date. Please advise.

## 2022-12-30 NOTE — Telephone Encounter (Signed)
Spoke with patient and he is aware that he does not have to be scheduled for the colon prior to surgery. He knows to contact us after he has healed from surgery to schedule the colonoscopy.

## 2023-01-05 ENCOUNTER — Encounter (HOSPITAL_COMMUNITY): Payer: Self-pay | Admitting: Surgery

## 2023-01-05 NOTE — Progress Notes (Signed)
SDW call  Patient was given pre-op instructions over the phone via three-way with his sister Debarah Crape. Patient verbalized understanding of instructions provided.     PCP - Dr. Eilleen Kempf Sagardia Cardiologist -  Pulmonary:    PPM/ICD - Denies Device Orders - na Rep Notified - na   Chest x-ray - na EKG -  na Stress Test - ECHO -  Cardiac Cath -   Sleep Study/sleep apnea/CPAP: denies  Non-diabetic  Blood Thinner Instructions: denies Aspirin Instructions:denies   ERAS Protcol - Clear fluids until 0900   COVID TEST- na    Anesthesia review: No   Patient denies shortness of breath, fever, cough and chest pain over the phone call  Your procedure is scheduled on Wednesday January 07, 2023  Report to [facility] Main Entrance "A" at  0930  A.M., then check in with the Admitting office.  Call this number if you have problems the morning of surgery:  [phone]   If you have any questions prior to your surgery date call [phone]: Open Monday-Friday 8am-4pm If you experience any cold or flu symptoms such as cough, fever, chills, shortness of breath, etc. between now and your scheduled surgery, please notify us at the above number     Remember:  Do not eat after midnight the night before your surgery  You may drink clear liquids until  0900   the morning of your surgery.   Clear liquids allowed are: Water, Non-Citrus Juices (without pulp), Carbonated Beverages, Clear Tea, Black Coffee ONLY (NO MILK, CREAM OR POWDERED CREAMER of any kind), and Gatorade   Take these medicines as needed the morning of surgery with A SIP OF WATER:  Tylenol  As of today, STOP taking any Aspirin (unless otherwise instructed by your surgeon) Aleve, Naproxen, Ibuprofen, Motrin, Advil, Goody's, BC's, all herbal medications, fish oil, and all vitamins.

## 2023-01-06 NOTE — Progress Notes (Signed)
Spoke to the pt's brother Thersa Salt, he will tell the pt to arrive in [place] tom at 1030. NPO post midnight and to stop clear liquids at 1000.

## 2023-01-07 ENCOUNTER — Encounter (HOSPITAL_COMMUNITY)
Admission: RE | Disposition: A | Discharge status: Home / Self Care | Payer: Self-pay | Source: Home / Self Care | Attending: Surgery

## 2023-01-07 ENCOUNTER — Encounter (HOSPITAL_COMMUNITY): Payer: Self-pay | Admitting: Surgery

## 2023-01-07 ENCOUNTER — Ambulatory Visit (HOSPITAL_COMMUNITY): Payer: Self-pay | Admitting: Anesthesiology

## 2023-01-07 ENCOUNTER — Ambulatory Visit (HOSPITAL_COMMUNITY)
Admission: RE | Admit: 2023-01-07 | Discharge: 2023-01-07 | Disposition: A | Discharge status: Home / Self Care | Payer: Self-pay | Attending: Surgery | Admitting: Surgery

## 2023-01-07 ENCOUNTER — Other Ambulatory Visit: Payer: Self-pay

## 2023-01-07 ENCOUNTER — Ambulatory Visit (HOSPITAL_BASED_OUTPATIENT_CLINIC_OR_DEPARTMENT_OTHER): Payer: Self-pay | Admitting: Anesthesiology

## 2023-01-07 DIAGNOSIS — L859 Epidermal thickening, unspecified: Secondary | ICD-10-CM | POA: Insufficient documentation

## 2023-01-07 DIAGNOSIS — K60313 Anal fistula, simple, recurrent: Secondary | ICD-10-CM

## 2023-01-07 DIAGNOSIS — Z87891 Personal history of nicotine dependence: Secondary | ICD-10-CM | POA: Insufficient documentation

## 2023-01-07 DIAGNOSIS — K60329 Anal fistula, complex, unspecified: Secondary | ICD-10-CM | POA: Insufficient documentation

## 2023-01-07 HISTORY — PX: RECTAL EXAM UNDER ANESTHESIA: SHX6399

## 2023-01-07 HISTORY — PX: PLACEMENT OF SETON: SHX6029

## 2023-01-07 LAB — CBC
HCT: 42.6 % (ref 39.0–52.0)
Hemoglobin: 14.8 g/dL (ref 13.0–17.0)
MCH: 28.8 pg (ref 26.0–34.0)
MCHC: 34.7 g/dL (ref 30.0–36.0)
MCV: 83 fL (ref 80.0–100.0)
Platelets: 228 10*3/uL (ref 150–400)
RBC: 5.13 MIL/uL (ref 4.22–5.81)
RDW: 12 % (ref 11.5–15.5)
WBC: 7.3 10*3/uL (ref 4.0–10.5)
nRBC: 0 % (ref 0.0–0.2)

## 2023-01-07 SURGERY — EXAM UNDER ANESTHESIA, RECTUM
Anesthesia: General | Site: Rectum

## 2023-01-07 MED ORDER — MIDAZOLAM HCL 2 MG/2ML IJ SOLN
INTRAMUSCULAR | Status: AC
  Filled 2023-01-07: qty 2

## 2023-01-07 MED ORDER — BUPIVACAINE LIPOSOME 1.3 % IJ SUSP
INTRAMUSCULAR | Status: DC | PRN
  Administered 2023-01-07: 20 mL

## 2023-01-07 MED ORDER — FENTANYL CITRATE (PF) 100 MCG/2ML IJ SOLN
INTRAMUSCULAR | Status: AC
  Filled 2023-01-07: qty 2

## 2023-01-07 MED ORDER — MIDAZOLAM HCL 2 MG/2ML IJ SOLN
INTRAMUSCULAR | Status: DC | PRN
  Administered 2023-01-07: 2 mg via INTRAVENOUS

## 2023-01-07 MED ORDER — LIDOCAINE 2% (20 MG/ML) 5 ML SYRINGE
INTRAMUSCULAR | Status: DC | PRN
  Administered 2023-01-07: 100 mg via INTRAVENOUS

## 2023-01-07 MED ORDER — ROCURONIUM BROMIDE 10 MG/ML (PF) SYRINGE
PREFILLED_SYRINGE | INTRAVENOUS | Status: DC | PRN
  Administered 2023-01-07: 40 mg via INTRAVENOUS

## 2023-01-07 MED ORDER — FENTANYL CITRATE (PF) 250 MCG/5ML IJ SOLN
INTRAMUSCULAR | Status: DC | PRN
  Administered 2023-01-07 (×4): 50 ug via INTRAVENOUS

## 2023-01-07 MED ORDER — PROPOFOL 10 MG/ML IV BOLUS
INTRAVENOUS | Status: DC | PRN
  Administered 2023-01-07: 200 mg via INTRAVENOUS

## 2023-01-07 MED ORDER — PROPOFOL 10 MG/ML IV BOLUS
INTRAVENOUS | Status: AC
  Filled 2023-01-07: qty 20

## 2023-01-07 MED ORDER — BUPIVACAINE HCL (PF) 0.25 % IJ SOLN
INTRAMUSCULAR | Status: AC
  Filled 2023-01-07: qty 30

## 2023-01-07 MED ORDER — TRAMADOL HCL 50 MG PO TABS
50.0000 mg | ORAL_TABLET | Freq: Four times a day (QID) | ORAL | 0 refills | Status: AC | PRN
Start: 2023-01-07 — End: 2023-01-12

## 2023-01-07 MED ORDER — 0.9 % SODIUM CHLORIDE (POUR BTL) OPTIME
TOPICAL | Status: DC | PRN
  Administered 2023-01-07: 1000 mL

## 2023-01-07 MED ORDER — ONDANSETRON HCL 4 MG/2ML IJ SOLN
INTRAMUSCULAR | Status: AC
  Filled 2023-01-07: qty 2

## 2023-01-07 MED ORDER — ACETAMINOPHEN 325 MG PO TABS: 325.0000 mg | ORAL_TABLET | ORAL | Status: DC | PRN

## 2023-01-07 MED ORDER — DEXAMETHASONE SODIUM PHOSPHATE 10 MG/ML IJ SOLN
INTRAMUSCULAR | Status: AC
  Filled 2023-01-07: qty 1

## 2023-01-07 MED ORDER — OXYCODONE HCL 5 MG PO TABS
5.0000 mg | ORAL_TABLET | Freq: Once | ORAL | Status: AC | PRN
  Administered 2023-01-07: 5 mg via ORAL

## 2023-01-07 MED ORDER — ACETAMINOPHEN 160 MG/5ML PO SOLN: 325.0000 mg | ORAL | Status: DC | PRN

## 2023-01-07 MED ORDER — CHLORHEXIDINE GLUCONATE 0.12 % MT SOLN
15.0000 mL | Freq: Once | OROMUCOSAL | Status: AC
  Administered 2023-01-07: 15 mL via OROMUCOSAL
  Filled 2023-01-07: qty 15

## 2023-01-07 MED ORDER — ORAL CARE MOUTH RINSE: 15.0000 mL | Freq: Once | OROMUCOSAL | Status: AC

## 2023-01-07 MED ORDER — KETOROLAC TROMETHAMINE 30 MG/ML IJ SOLN
INTRAMUSCULAR | Status: AC
  Filled 2023-01-07: qty 1

## 2023-01-07 MED ORDER — LIDOCAINE 2% (20 MG/ML) 5 ML SYRINGE
INTRAMUSCULAR | Status: AC
  Filled 2023-01-07: qty 5

## 2023-01-07 MED ORDER — BUPIVACAINE LIPOSOME 1.3 % IJ SUSP
INTRAMUSCULAR | Status: AC
  Filled 2023-01-07: qty 20

## 2023-01-07 MED ORDER — FENTANYL CITRATE (PF) 250 MCG/5ML IJ SOLN
INTRAMUSCULAR | Status: AC
  Filled 2023-01-07: qty 5

## 2023-01-07 MED ORDER — MEPERIDINE HCL 25 MG/ML IJ SOLN: 6.2500 mg | INTRAMUSCULAR | Status: DC | PRN

## 2023-01-07 MED ORDER — KETOROLAC TROMETHAMINE 30 MG/ML IJ SOLN
INTRAMUSCULAR | Status: DC | PRN
Start: 2023-01-07 — End: 2023-01-07
  Administered 2023-01-07: 30 mg via INTRAVENOUS

## 2023-01-07 MED ORDER — ONDANSETRON HCL 4 MG/2ML IJ SOLN: 4.0000 mg | Freq: Once | INTRAMUSCULAR | Status: DC | PRN

## 2023-01-07 MED ORDER — DEXAMETHASONE SODIUM PHOSPHATE 10 MG/ML IJ SOLN
INTRAMUSCULAR | Status: DC | PRN
  Administered 2023-01-07: 5 mg via INTRAVENOUS

## 2023-01-07 MED ORDER — ACETAMINOPHEN 500 MG PO TABS: 1000.0000 mg | ORAL_TABLET | ORAL | Status: DC

## 2023-01-07 MED ORDER — FENTANYL CITRATE (PF) 100 MCG/2ML IJ SOLN
25.0000 ug | INTRAMUSCULAR | Status: DC | PRN
  Administered 2023-01-07: 50 ug via INTRAVENOUS

## 2023-01-07 MED ORDER — KETAMINE HCL 50 MG/5ML IJ SOSY
PREFILLED_SYRINGE | INTRAMUSCULAR | Status: AC
  Filled 2023-01-07: qty 5

## 2023-01-07 MED ORDER — OXYCODONE HCL 5 MG PO TABS
ORAL_TABLET | ORAL | Status: AC
  Filled 2023-01-07: qty 1

## 2023-01-07 MED ORDER — SUGAMMADEX SODIUM 200 MG/2ML IV SOLN
INTRAVENOUS | Status: DC | PRN
  Administered 2023-01-07: 200 mg via INTRAVENOUS

## 2023-01-07 MED ORDER — SODIUM CHLORIDE 0.9 % IV SOLN
INTRAVENOUS | Status: DC | PRN
Start: 2023-01-07 — End: 2023-01-07

## 2023-01-07 MED ORDER — ONDANSETRON HCL 4 MG/2ML IJ SOLN
INTRAMUSCULAR | Status: DC | PRN
  Administered 2023-01-07: 4 mg via INTRAVENOUS

## 2023-01-07 MED ORDER — OXYCODONE HCL 5 MG/5ML PO SOLN: 5.0000 mg | Freq: Once | ORAL | Status: AC | PRN

## 2023-01-07 MED ORDER — BUPIVACAINE LIPOSOME 1.3 % IJ SUSP: 20.0000 mL | Freq: Once | INTRAMUSCULAR | Status: DC

## 2023-01-07 SURGICAL SUPPLY — 36 items
BAG COUNTER SPONGE SURGICOUNT (BAG) ×2 IMPLANT
BAG SPNG CNTER NS LX DISP (BAG)
BLADE SURG 15 STRL LF DISP TIS (BLADE) ×2 IMPLANT
BLADE SURG 15 STRL SS (BLADE) ×1
BRIEF MESH DISP LRG (UNDERPADS AND DIAPERS) ×2 IMPLANT
CANISTER SUCT 3000ML PPV (MISCELLANEOUS) ×2 IMPLANT
COVER SURGICAL LIGHT HANDLE (MISCELLANEOUS) ×2 IMPLANT
ELECT CAUTERY BLADE 6.4 (BLADE) ×2 IMPLANT
GAUZE PAD ABD 8X10 STRL (GAUZE/BANDAGES/DRESSINGS) ×2 IMPLANT
GAUZE SPONGE 4X4 12PLY STRL (GAUZE/BANDAGES/DRESSINGS) ×2 IMPLANT
GLOVE BIO SURGEON STRL SZ7.5 (GLOVE) ×2 IMPLANT
GLOVE INDICATOR 8.0 STRL GRN (GLOVE) ×2 IMPLANT
GOWN STRL REUS W/ TWL LRG LVL3 (GOWN DISPOSABLE) ×2 IMPLANT
GOWN STRL REUS W/ TWL XL LVL3 (GOWN DISPOSABLE) ×2 IMPLANT
GOWN STRL REUS W/TWL LRG LVL3 (GOWN DISPOSABLE) ×1
GOWN STRL REUS W/TWL XL LVL3 (GOWN DISPOSABLE) ×1
KIT BASIN OR (CUSTOM PROCEDURE TRAY) ×2 IMPLANT
KIT TURNOVER KIT B (KITS) ×2 IMPLANT
LOOP VASCLR MAXI BLUE 18IN ST (MISCELLANEOUS) IMPLANT
LOOP VASCULAR MAXI 18 BLUE (MISCELLANEOUS) ×1
NDL HYPO 25GX1X1/2 BEV (NEEDLE) ×2 IMPLANT
NEEDLE HYPO 25GX1X1/2 BEV (NEEDLE) ×1
NS IRRIG 1000ML POUR BTL (IV SOLUTION) ×2 IMPLANT
PACK LITHOTOMY IV (CUSTOM PROCEDURE TRAY) ×2 IMPLANT
PAD ARMBOARD 7.5X6 YLW CONV (MISCELLANEOUS) ×2 IMPLANT
PENCIL BUTTON HOLSTER BLD 10FT (ELECTRODE) ×2 IMPLANT
SPONGE T-LAP 4X18 ~~LOC~~+RFID (SPONGE) ×2 IMPLANT
SURGILUBE 2OZ TUBE FLIPTOP (MISCELLANEOUS) ×2 IMPLANT
SUT CHROMIC 3 0 SH 27 (SUTURE) ×2 IMPLANT
SUT SILK 2 0 TIES 17X18 (SUTURE) ×1
SUT SILK 2-0 18XBRD TIE BLK (SUTURE) IMPLANT
SYR CONTROL 10ML LL (SYRINGE) ×2 IMPLANT
TOWEL GREEN STERILE (TOWEL DISPOSABLE) ×2 IMPLANT
TUBE CONNECTING 12X1/4 (SUCTIONS) ×2 IMPLANT
VASCULAR TIE MAXI BLUE 18IN ST (MISCELLANEOUS) ×1
YANKAUER SUCT BULB TIP NO VENT (SUCTIONS) ×2 IMPLANT

## 2023-01-07 NOTE — Anesthesia Preprocedure Evaluation (Addendum)
Anesthesia Evaluation  Patient identified by MRN, date of birth, ID band Patient awake    Reviewed: Allergy & Precautions, H&P , NPO status , Patient's Chart, lab work & pertinent test results  Airway Mallampati: II  TM Distance: >3 FB Neck ROM: Full    Dental no notable dental hx.    Pulmonary former smoker   Pulmonary exam normal breath sounds clear to auscultation       Cardiovascular Exercise Tolerance: Good negative cardio ROS Normal cardiovascular exam Rhythm:Regular Rate:Normal     Neuro/Psych negative neurological ROS  negative psych ROS   GI/Hepatic negative GI ROS, Neg liver ROS,,,  Endo/Other  negative endocrine ROS    Renal/GU negative Renal ROS  negative genitourinary   Musculoskeletal negative musculoskeletal ROS (+)    Abdominal  (+) + obese  Peds negative pediatric ROS (+)  Hematology negative hematology ROS (+)   Anesthesia Other Findings   Reproductive/Obstetrics negative OB ROS                             Anesthesia Physical Anesthesia Plan  ASA: 2  Anesthesia Plan: General   Post-op Pain Management: Tylenol PO (pre-op)* and Celebrex PO (pre-op)*   Induction: Intravenous  PONV Risk Score and Plan: 2 and Ondansetron, Treatment may vary due to age or medical condition and Dexamethasone  Airway Management Planned: Oral ETT and LMA  Additional Equipment: None  Intra-op Plan:   Post-operative Plan: Extubation in OR  Informed Consent: I have reviewed the patients History and Physical, chart, labs and discussed the procedure including the risks, benefits and alternatives for the proposed anesthesia with the patient or authorized representative who has indicated his/her understanding and acceptance.     Dental advisory given  Plan Discussed with: CRNA, Surgeon and Anesthesiologist  Anesthesia Plan Comments:        Anesthesia Quick Evaluation

## 2023-01-07 NOTE — Transfer of Care (Signed)
Immediate Anesthesia Transfer of Care Note  Patient: Travis Ryan  Procedure(s) Performed: RECTAL EXAM UNDER ANESTHESIA FISTULA TRANSSPHINCTERIC POSSIBLE PLACEMENT OF SETON  Patient Location: PACU  Anesthesia Type:General  Level of Consciousness: awake, alert , oriented, and patient cooperative  Airway & Oxygen Therapy: Patient Spontanous Breathing and Patient connected to face mask oxygen  Post-op Assessment: Report given to RN and Post -op Vital signs reviewed and stable  Post vital signs: Reviewed and stable  Last Vitals:  Vitals Value Taken Time  BP 122/84 01/07/23 1515  Temp    Pulse 77 01/07/23 1517  Resp 13 01/07/23 1517  SpO2 89 % 01/07/23 1517  Vitals shown include unfiled device data.  Last Pain:  Vitals:   01/07/23 1051  TempSrc: Oral         Complications: No notable events documented.

## 2023-01-07 NOTE — Op Note (Signed)
01/07/2023  2:55 PM  PATIENT:  Travis Ryan  47 y.o. male  Patient Care Team: Georgina Quint, MD as PCP - General (Internal Medicine) Patient, No Pcp Per (General Practice)  PRE-OPERATIVE DIAGNOSIS:  Anal fistula, transsphincteric  POST-OPERATIVE DIAGNOSIS:  Same x2  PROCEDURE:   Surgical treatment of transsphincteric anal fistula with placement of draining seton Surgical treatment of transsphincteric anal fistula with placement of draining seton Anorectal exam under anesthesia  SURGEON:  Surgeon(s): Andria Meuse, MD  ASSISTANT: OR Staff  ANESTHESIA:   local and general  SPECIMEN:  Anal fistula tissue (left anterior, near external opening)  DISPOSITION OF SPECIMEN:  PATHOLOGY  COUNTS:  Sponge, needle, and instrument counts were reported correct x2 at conclusion.  EBL: 5 mL  Drains: Draining seton x 2  PLAN OF CARE: Discharge to home after PACU  PATIENT DISPOSITION:  PACU - hemodynamically stable.  OR FINDINGS: Transsphincteric anal fistula with 2 discrete separate external openings.  There is a left anterior and a left posterior external opening at the anal margin.  Internal opening in the left lateral position just at/above the dentate line.  The 2 external openings do not appear to communicate aside from their shared internal opening.  Significant induration of the tissues around the external opening with heaped up granulation tissue and redundant tags. A sample of chronic perianal scar/granulation from left anterior external opening was excised and submitted for pathology.   DESCRIPTION: The patient was identified in the preoperative holding area and taken to the OR. SCDs were applied. He then underwent general endotracheal anesthesia without difficulty. The patient was then rolled onto the OR table in the prone jackknife position. Pressure points were then evaluated and padded. Benzoin was applied to the buttocks and they were gently taped apart.   He was then prepped and draped in usual sterile fashion.  A surgical timeout was performed indicating the correct patient, procedure, and positioning.  A perianal block was then created using a dilute mixture of 0.25% Marcaine with epinephrine and Exparel.  After ascertaining an appropriate level of anesthesia had been achieved, a well lubricated digital rectal exam was performed. This demonstrated no palpable masses but a suspicious divot in the left lateral position which may represent an internal opening.  A Hill-Ferguson anoscope was into the anal canal and circumferential inspection demonstrated healthy appearing anoderm with suggestion of an internal opening in the left lateral position just above the dentate line.  No anal fissures.  Externally, there are significant amounts of heaped up granulation/woody edematous indurated tissues around and obvious appearing external opening in the left anterior position.  There is also a punctate pustule in the left posterior position.  There are no large perianal abscesses.  With the anoscope in place a flexible fistula probe was insinuated into the external opening left anterior position.  This was then carefully advanced and found to communicate with the internal opening in the left lateral position.  No false passes were made.  The fistula probe was then exchanged for a blue vessel loop seton which was ultimately secured to itself using 2-0 silk ties.  The left posterior punctate opening was then carefully probed as well in a similar manner.  There is no evident communication with the left anterior external opening.  There is apparent communication with a shared internal opening in the left lateral position.  This is similarly controlled with a blue vessel loop seton secured with 2-0 silk ties.  The left anterior chronic indurated  type tissue just lateral to the external opening was excised and passed off as a specimen. Hemostasis was achieved electrocautery.   The area was irrigated.  Hemostasis verified. The right buttock was carefully evaluated and there is no swelling or suggestion of pathology on the right side. All sponge, needle, and instrument counts were reported correct.  The wound was then dressed with 4 x 4's, ABD, and dressed with mesh underwear.  He was then rolled back onto a stretcher, awakened from anesthesia, extubated, and transported to the recovery room in satisfactory condition.  DISPOSITION: PACU in satisfactory condition.

## 2023-01-07 NOTE — Anesthesia Procedure Notes (Signed)
Procedure Name: Intubation Date/Time: 01/07/2023 2:06 PM  Performed by: Thomasene Ripple, CRNAPre-anesthesia Checklist: Patient identified, Emergency Drugs available, Suction available and Patient being monitored Patient Re-evaluated:Patient Re-evaluated prior to induction Oxygen Delivery Method: Circle System Utilized Preoxygenation: Pre-oxygenation with 100% oxygen Induction Type: IV induction Ventilation: Mask ventilation without difficulty and Oral airway inserted - appropriate to patient size Laryngoscope Size: Miller and 3 Grade View: Grade I Tube type: Oral Number of attempts: 1 Airway Equipment and Method: Stylet and Oral airway Placement Confirmation: ETT inserted through vocal cords under direct vision, positive ETCO2 and breath sounds checked- equal and bilateral Tube secured with: Tape Dental Injury: Teeth and Oropharynx as per pre-operative assessment

## 2023-01-07 NOTE — H&P (Addendum)
CC: Here today for surgery  HPI: Travis Ryan is an 47 y.o. male with history of seasonal allergies and prior perianal abscesses, whom is seen in the office today for evaluation. He was seen last week in our urgent office with Puja.  First developed some discomfort/perianal pain back in October, 2023. He was found to have a left perianal abscess and suspected anal fissure. He was given Augmentin, nifedipine cream, and Anusol suppositories. He was subsequently referred to see GI-Dr. Tomasa Rand and colorectal surgery but did not make an appointment at that time due to lack of insurance. He had persistent to some degree perianal pain in the interim. He notes some minor tight pressure in the perianal area. Some yellowish drainage occasionally as well. Will wear pads to control the amount of drainage she has had in the past.  Denies ever having had a colonoscopy. 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 He underwent incision/drainage in the office. He was subsequently scheduled to see [place] for follow-up. He denies any history of any prior anal abscesses, swelling, or pain prior to October, 2023.  Overall since having an I&D last week, reports all of his symptoms have improved. Currently no significant perianal pain. Scant drainage. No fever or chills.  He is fluent in [place] and was offered but declined medical interpreter.  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 2 external openings both of which communicate. 1 internal opening in the left lateral position at the level of the dentate line. Partial fistulotomy carried out between the 2 external openings to facilitate drainage in the internal most  external opening was controlled with a draining blue vessel loop seton to the internal opening. 10 cc of pus was drained from a left ischial rectal fossa abscess.    OR 08/21/22 - LIFT  OR FINDINGS: Left anterior transsphincteric anal fistula. Normal anal canal otherwise. Indwelling seton. No large ischial rectal fossa abscesses or active infection evident. Ligation of intersphincteric fistula tract (LIFT) carried out uneventfully.   Returns today for follow-up. He has been doing well. Quite satisfied with everything. He reports that he has had steady drainage since surgery but every week has been less and less. He denies any complaints at present. No significant pain. Wearing a pad to help with drainage from his closing external opening. No perirectal throbbing or pain to suggest any recurrent abscesses. Good control over stool.  Urgent office 10/03/2022 He is presenting to urgent office today with concerns of a burning pain that occurs at the left-sided external opening area. He states the pain begins after he walks for some time and then stops. He also complains of the pain radiating down his anterior legs into his lower extremities. He was only taking tramadol about once a week. He states he has daily drainage from the area which is mostly clear with some blood clots. However, what about once a week he notices puslike drainage and that occurs when the pain is at its peak. He denies fever/chills. He states his bowels are moving well. He was doing warm tub soaks until recently.  He denies any changes in health or health history since we met in the office. No new medications/allergies. He states he is ready for surgery today.  PMH: Seasonal allergies  PSH: I&D  of perianal abscess x1 as above  FHx: Denies any known family history of colorectal, breast, endometrial or ovarian cancer  Social Hx: Drinks ~1 beer per day. Denies use of tobacco/illicit drug. He works in Chief of Staff - primarily industrial  now.    Past Medical History:  Diagnosis Date   Anal fistula    Perirectal abscess    Seasonal allergies     Past Surgical History:  Procedure Laterality Date   FISTULOTOMY N/A 05/15/2022   Procedure: CONTROL OF ANAL FISTULA/INTERROGATION WITH Partial FISTULOTOMY and SETON, POSSIBLE INCISION/DRAINAGE OF PERIRECTAL ABSCESS;  Surgeon: Andria Meuse, MD;  Location: [facility];  Service: General;  Laterality: N/A;   LIGATION OF INTERNAL FISTULA TRACT N/A 08/21/2022   Procedure: LIGATION OF INTERSPHINCTERIC FISTULA TRACT;  Surgeon: Andria Meuse, MD;  Location: [facility];  Service: General;  Laterality: N/A;   RECTAL EXAM UNDER ANESTHESIA N/A 08/21/2022   Procedure: ANORECTAL EXAM UNDER ANESTHESIA;  Surgeon: Andria Meuse, MD;  Location: [facility];  Service: General;  Laterality: N/A;   UMBILICAL HERNIA REPAIR  2018    Family History  Problem Relation Age of Onset   Cancer Maternal Grandmother    Early death Maternal Grandfather    Cancer Paternal Grandmother    Early death Paternal Grandfather    Stomach cancer Neg Hx    Esophageal cancer Neg Hx    Colon cancer Neg Hx     Social:  reports that he quit smoking about 14 years ago. His smoking use included cigarettes. He started smoking about 19 years ago. He has never used smokeless tobacco. He reports current alcohol use of about 14.0 standard drinks of alcohol per week. He reports that he does not use drugs.  Allergies: No Known Allergies  Medications: I have reviewed the patient's current medications.  Results for orders placed or performed during the hospital encounter of 01/07/23 (from the past 48 hour(s))  CBC per protocol     Status: None   Collection Time: 01/07/23 10:55 AM  Result Value Ref Range   WBC 7.3 4.0 - 10.5 K/uL   RBC 5.13 4.22 - 5.81 MIL/uL   Hemoglobin 14.8 13.0 - 17.0 g/dL   HCT 27.2 53.6 - 64.4 %   MCV 83.0 80.0 - 100.0 fL   MCH 28.8  26.0 - 34.0 pg   MCHC 34.7 30.0 - 36.0 g/dL   RDW 03.4 74.2 - 59.5 %   Platelets 228 150 - 400 K/uL   nRBC 0.0 0.0 - 0.2 %    Comment: Performed at [facility], [address], [place], [place] [address]    No results found.   PE Blood pressure (!) 154/108, pulse 85, temperature 98 F (36.7 C), temperature source Oral, resp. rate 18, height 5\' 9"  (1.753 m), weight 102.1 kg, SpO2 95%. Constitutional: NAD; conversant Eyes: Moist conjunctiva; no lid lag; anicteric Lungs: Normal respiratory effort CV: RRR (From last visit - "Anorectal: Left-sided external opening with hypergranulation tissue suspicious for external open to fistula with a second punctate opening approximately 2 cm cephalad to this. No fluctuance. Everything does appear to have drained. DRE-normal to increased tone. Somewhat limited by pelvic floor spasticity. Anoscopy: Circumferential anoscopy somewhat limited due to spasticity of pelvic floor muscles but no clearly obvious pathology can be seen within the anal canal. No obvious fissure or significant amount of granulation tissue. " Not repeated today) Psychiatric: Appropriate affect  Results for orders placed or performed  during the hospital encounter of 01/07/23 (from the past 48 hour(s))  CBC per protocol     Status: None   Collection Time: 01/07/23 10:55 AM  Result Value Ref Range   WBC 7.3 4.0 - 10.5 K/uL   RBC 5.13 4.22 - 5.81 MIL/uL   Hemoglobin 14.8 13.0 - 17.0 g/dL   HCT 40.9 81.1 - 91.4 %   MCV 83.0 80.0 - 100.0 fL   MCH 28.8 26.0 - 34.0 pg   MCHC 34.7 30.0 - 36.0 g/dL   RDW 78.2 95.6 - 21.3 %   Platelets 228 150 - 400 K/uL   nRBC 0.0 0.0 - 0.2 %    Comment: Performed at [facility], [address] [place], [place] [address]    No results found.  A/P: Travis Ryan is an 47 y.o. 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  -Constellation of findings concerning for 2  discrete external openings which I suspect are connected. May have had fistula recurrence.  -Referral to [facility] for colonoscopy and to evaluate for Crohn's underway - he has appointment with them scheduled  -The anatomy and physiology of the anal canal was discussed with the patient with associated pictures. The pathophysiology of anal abscess and fistula was discussed at length with associated pictures and illustrations using the ASCRS trifold handout  -We have reviewed options going forward including further observation vs surgery -surgical treatment of anal fistula with possible placement of draining seton; anorectal exam under anesthesia.  -The planned procedure, material risks (including, but not limited to, pain, bleeding, infection, scarring, need for blood transfusion, damage to anal sphincter, incontinence of gas and/or stool, need for additional procedures, anal stenosis, rare cases of pelvic sepsis which in severe cases may require things like a colostomy, recurrence, pneumonia, heart attack, stroke, death) benefits and alternatives to surgery were discussed at length. I noted a good probability that the procedure would help improve their symptoms. The patient's questions were answered to his satisfaction, he voiced understanding and elected to proceed with surgery. Additionally, we discussed typical postoperative expectations and the recovery process.   He would like me to speak with Debarah Crape when we are finished with his procedure today  All the above completed with a spanish interpreter present but he had declined needing one and therefore much conducted in [place] as well   Marin Olp, MD [facility], A [facility]

## 2023-01-07 NOTE — Discharge Instructions (Addendum)
ANORECTAL SURGERY: POST OP INSTRUCTIONS An anal fistula was identified. There are 2 separate fistulas that share an 'internal opening' within the anal canal. Blue setons were placed controlling both tracts.  DIET: Follow a light bland diet the first 24 hours after arrival home, such as soup, liquids, crackers, etc.  Be sure to include lots of fluids daily.  Avoid fast food or heavy meals as your are more likely to get nauseated.  Eat a low fat diet the next few days after surgery.   Some bleeding with bowel movements is expected for the first couple of days but this should stop in between bowel movements  Take your usually prescribed home medications unless otherwise directed. No foreign bodies per rectum for the next 3 months (enemas, etc)  PAIN CONTROL: It is helpful to take an over-the-counter pain medication regularly for the first few days/weeks.  Choose from the following that works best for you: Ibuprofen (Advil, etc) Three 200mg  tabs every 6 hours as needed. Acetaminophen (Tylenol, etc) 500-650mg  every 6 hours as needed NOTE: You may take both of these medications together - most patients find it most helpful when alternating between the two (i.e. Ibuprofen at 6am, tylenol at 9am, ibuprofen at 12pm ..Marland Kitchen) A  prescription for pain medication may have been prescribed for you at discharge.  Take your pain medication as prescribed.  If you are having problems/concerns with the prescription medicine, please call us for further advice.  Avoid getting constipated.  Between the surgery and the pain medications, it is common to experience some constipation.  Increasing fluid intake (64oz of water per day) and taking a fiber supplement (such as Metamucil, Citrucel, FiberCon) 1-2 times a day regularly will usually help prevent this problem from occurring.  Take Miralax (over the counter) 1-2x/day while taking a narcotic pain medication. If no bowel movement after 48hours, you may additionally take a  laxative like a bottle of Milk of Magnesia which can be purchased over the counter. Avoid enemas.   Watch out for diarrhea.  If you have many loose bowel movements, simplify your diet to bland foods.  Stop any stool softeners and decrease your fiber supplement. If this worsens or does not improve, please call us.  Wash / shower every day.  If you were discharged with a dressing, you may remove this the day after your surgery. You may shower normally, getting soap/water on your wound, particularly after bowel movements.  Soaking in a warm bath filled a couple inches ("Sitz bath") is a great way to clean the area after a bowel movement and many patients find it is a way to soothe the area.  ACTIVITIES as tolerated:   You may resume regular (light) daily activities beginning the next day--such as daily self-care, walking, climbing stairs--gradually increasing activities as tolerated.  If you can walk 30 minutes without difficulty, it is safe to try more intense activity such as jogging, treadmill, bicycling, low-impact aerobics, etc. Refrain from any heavy lifting or straining for the first 2 weeks after your procedure, particularly if your surgery was for hemorrhoids. Avoid activities that make your pain worse You may drive when you are no longer taking prescription pain medication, you can comfortably wear a seatbelt, and you can safely maneuver your car and apply brakes.  FOLLOW UP in our office Please call [facility] at [phone] to set up an appointment to see your surgeon in the office for a follow-up appointment approximately 2 weeks after your surgery. Make sure that  you call for this appointment the day you arrive home to insure a convenient appointment time.  9. If you have disability or family leave forms that need to be completed, you may have them completed by your primary care physician's office; for return to work instructions, please ask our office staff and they will be happy to assist  you in obtaining this documentation   When to call us [phone]: Poor pain control Reactions / problems with new medications (rash/itching, etc)  Fever over 101.5 F (38.5 C) Inability to urinate Nausea/vomiting Worsening swelling or bruising Continued bleeding from incision. Increased pain, redness, or drainage from the incision  The clinic staff is available to answer your questions during regular business hours (8:30am-5pm).  Please don't hesitate to call and ask to speak to one of our nurses for clinical concerns.   A surgeon from [facility] is always on call at the hospitals   If you have a medical emergency, go to the nearest emergency room or call 911.   [facility] [address], [place], [place]  [address] MAIN: [phone] FAX: [phone] [email or web address]

## 2023-01-07 NOTE — Anesthesia Postprocedure Evaluation (Signed)
Anesthesia Post Note  Patient: Travis Ryan  Procedure(s) Performed: RECTAL EXAM UNDER ANESTHESIA FISTULA TRANSSPHINCTERIC POSSIBLE PLACEMENT OF SETON     Patient location during evaluation: PACU Anesthesia Type: General Level of consciousness: awake and alert Pain management: pain level controlled Vital Signs Assessment: post-procedure vital signs reviewed and stable Respiratory status: spontaneous breathing, nonlabored ventilation, respiratory function stable and patient connected to nasal cannula oxygen Cardiovascular status: blood pressure returned to baseline and stable Postop Assessment: no apparent nausea or vomiting Anesthetic complications: no   No notable events documented.  Last Vitals:  Vitals:   01/07/23 1530 01/07/23 1545  BP: 125/78 (!) 125/91  Pulse: 88 81  Resp: 20 15  Temp:    SpO2: 92% 95%    Last Pain:  Vitals:   01/07/23 1545  TempSrc:   PainSc: 6                  Nicolle Heward

## 2023-01-07 NOTE — Progress Notes (Signed)
Dr. Tacy Dura made aware of patient's elevated BP readings upon arrival to Short Stay. No new orders received.

## 2023-01-08 ENCOUNTER — Encounter (HOSPITAL_COMMUNITY): Payer: Self-pay | Admitting: Surgery

## 2023-01-08 LAB — SURGICAL PATHOLOGY

## 2023-02-05 ENCOUNTER — Telehealth: Payer: Self-pay

## 2023-02-05 ENCOUNTER — Encounter: Payer: Self-pay | Admitting: Gastroenterology

## 2023-02-05 NOTE — Telephone Encounter (Signed)
Pt scheduled for previsit 03/03/23 at 2pm, colon scheduled in the [facility] 03/11/23 at 4pm.

## 2023-02-05 NOTE — Telephone Encounter (Signed)
-----   Message from Jenel Lucks sent at 02/04/2023  5:12 AM EST ----- Ok thanks [place], we will get him set up for a colonoscopy.  Thanks for taking care of him.  Bonita Quin,  Can you get him scheduled for a routine colonoscopy to exclude Crohn's disease? (dx: perianal fistula) ----- Message ----- From: Andria Meuse, MD Sent: 02/03/2023  10:17 AM EST To: Jenel Lucks, MD  Hi Scott -   This patient was going to see you for colonoscopy - concerns for perianal Crohn's disease. He has setons in place now (x2) for 2 separate fistulae. The referral was closed I believe as he was being scheduled for additional seton placement but I had my office re-place.   Appreciate your help seeing him. Very easy going nice guy.  Thayer Ohm

## 2023-02-05 NOTE — Telephone Encounter (Signed)
Travis Ryan will you schedule this pt for a previsit and colonoscopy. He speaks [place].

## 2023-02-05 NOTE — Telephone Encounter (Signed)
Patient has been scheduled

## 2023-03-03 ENCOUNTER — Ambulatory Visit (AMBULATORY_SURGERY_CENTER): Payer: Self-pay | Admitting: *Deleted

## 2023-03-03 VITALS — Ht 69.0 in | Wt 229.0 lb

## 2023-03-03 DIAGNOSIS — Z1211 Encounter for screening for malignant neoplasm of colon: Secondary | ICD-10-CM

## 2023-03-03 MED ORDER — NA SULFATE-K SULFATE-MG SULF 17.5-3.13-1.6 GM/177ML PO SOLN: 1.0000 | Freq: Once | ORAL | 0 refills | Status: AC

## 2023-03-03 NOTE — Progress Notes (Signed)
Pt's name and DOB verified at the beginning of the pre-visit wit 2 identifiers  Pt denies any difficulty with ambulating,sitting, laying down or rolling side to side  Pt has no issues with ambulation   Pt has no issues moving head neck or swallowing  No egg or soy allergy known to patient   No issues known to pt with past sedation with any surgeries or procedures  Pt denies having issues being intubated  No FH of Malignant Hyperthermia  Pt is not on diet pills or shots  Pt is not on home 02   Pt is not on blood thinners   Pt denies issues with constipation   Pt is not on dialysis  Pt denise any abnormal heart rhythms   Pt denies any upcoming cardiac testing  Pt encouraged to use to use Singlecare or Goodrx to reduce cost   Patient's chart reviewed by Cathlyn Parsons CNRA prior to pre-visit and patient appropriate for the [facility].  Pre-visit completed and red dot placed by patient's name on their procedure day (on provider's schedule).  .  Visit is in person with Spanish interpretor  Pt scale weight is 229 lb  Instructed pt why it is important to and  to call if they have any changes in health or new medications. Directed them to the # given and on instructions.     Instructions reviewed. Pt given both [facility] main # and MD on call # prior to instructions.  Pt states understanding. Instructed to review again prior to procedure. Pt states they will.   Instructions and coupon given to pt   Instructions sent through My Chart

## 2023-03-11 ENCOUNTER — Ambulatory Visit: Payer: Self-pay | Admitting: Gastroenterology

## 2023-03-11 ENCOUNTER — Encounter: Payer: Self-pay | Admitting: Gastroenterology

## 2023-03-11 VITALS — BP 114/79 | HR 73 | Temp 98.4°F | Resp 12 | Ht 69.0 in | Wt 229.0 lb

## 2023-03-11 DIAGNOSIS — K603 Anal fistula, unspecified: Secondary | ICD-10-CM

## 2023-03-11 DIAGNOSIS — Z1211 Encounter for screening for malignant neoplasm of colon: Secondary | ICD-10-CM

## 2023-03-11 MED ORDER — SODIUM CHLORIDE 0.9 % IV SOLN: 500.0000 mL | Freq: Once | INTRAVENOUS | Status: DC

## 2023-03-11 NOTE — Progress Notes (Unsigned)
[facility] History and Physical   Primary Care Physician:  Georgina Quint, MD   Reason for Procedure:   Perianal fistula, rule out Crohn's disease  Plan:    Colonoscopy     HPI: Travis Ryan is a 47 y.o. male colonoscopy to history of recurrent perianal fistula.  He has a history of perianal abscess in January treated with fistulotomy and seton placement and then subsequent LIFT with recurrence with subsequent repeat seton placement in Oct.  No symptoms of abdominal pain, diarrhea or hematochezia.   Past Medical History:  Diagnosis Date   Anal fistula    Asthma    Perirectal abscess    Seasonal allergies     Past Surgical History:  Procedure Laterality Date   FISTULOTOMY N/A 05/15/2022   Procedure: CONTROL OF ANAL FISTULA/INTERROGATION WITH Partial FISTULOTOMY and SETON, POSSIBLE INCISION/DRAINAGE OF PERIRECTAL ABSCESS;  Surgeon: Andria Meuse, MD;  Location: [facility];  Service: General;  Laterality: N/A;   LIGATION OF INTERNAL FISTULA TRACT N/A 08/21/2022   Procedure: LIGATION OF INTERSPHINCTERIC FISTULA TRACT;  Surgeon: Andria Meuse, MD;  Location: [facility];  Service: General;  Laterality: N/A;   PLACEMENT OF SETON N/A 01/07/2023   Procedure: FISTULA TRANSSPHINCTERIC PLACEMENT OF SETON x2;  Surgeon: Andria Meuse, MD;  Location: [facility];  Service: General;  Laterality: N/A;   RECTAL EXAM UNDER ANESTHESIA N/A 08/21/2022   Procedure: ANORECTAL EXAM UNDER ANESTHESIA;  Surgeon: Andria Meuse, MD;  Location: [facility];  Service: General;  Laterality: N/A;   RECTAL EXAM UNDER ANESTHESIA N/A 01/07/2023   Procedure: RECTAL EXAM UNDER ANESTHESIA;  Surgeon: Andria Meuse, MD;  Location: [facility];  Service: General;  Laterality: N/A;  60   UMBILICAL HERNIA REPAIR  2018    Prior to Admission medications   Medication Sig Start Date End Date Taking? Authorizing Provider   acetaminophen (TYLENOL) 500 MG tablet Take 1,000 mg by mouth every 8 (eight) hours as needed for moderate pain.   Yes [provider]  ibuprofen (ADVIL) 200 MG tablet Take 1,200 mg by mouth daily as needed for moderate pain.   Yes [provider]  traMADol (ULTRAM) 50 MG tablet TAKE 1 TABLET BY MOUTH EVERY 6 HOURS AS NEEDED FOR FOR PAIN FOR UP TO 5 DAYS IF NOT RELIEVED BY TYLENOL OR IBUPROFEN FIRST 01/07/23   [provider]    Current Outpatient Medications  Medication Sig Dispense Refill   acetaminophen (TYLENOL) 500 MG tablet Take 1,000 mg by mouth every 8 (eight) hours as needed for moderate pain.     ibuprofen (ADVIL) 200 MG tablet Take 1,200 mg by mouth daily as needed for moderate pain.     traMADol (ULTRAM) 50 MG tablet TAKE 1 TABLET BY MOUTH EVERY 6 HOURS AS NEEDED FOR FOR PAIN FOR UP TO 5 DAYS IF NOT RELIEVED BY TYLENOL OR IBUPROFEN FIRST     Current Facility-Administered Medications  Medication Dose Route Frequency Provider Last Rate Last Admin   0.9 %  sodium chloride infusion  500 mL Intravenous Once Jenel Lucks, MD        Allergies as of 03/11/2023   (No Known Allergies)    Family History  Problem Relation Age of Onset   Cancer Maternal Grandmother    Early death Maternal Grandfather    Cancer Paternal Grandmother    Early death Paternal Grandfather    Stomach cancer Neg Hx    Esophageal cancer  Neg Hx    Colon cancer Neg Hx    Colon polyps Neg Hx    Rectal cancer Neg Hx     Social History   Socioeconomic History   Marital status: Single    Spouse name: Not on file   Number of children: 0   Years of education: Not on file   Highest education level: Not on file  Occupational History   Occupation: floor install  Tobacco Use   Smoking status: Former    Current packs/day: 0.00    Types: Cigarettes    Start date: 2005    Quit date: 2010    Years since quitting: 14.9   Smokeless tobacco: Never  Vaping Use   Vaping status:  Never Used  Substance and Sexual Activity   Alcohol use: Yes    Alcohol/week: 14.0 standard drinks of alcohol    Types: 14 Cans of beer per week    Comment: 1 to 2 beers per day   Drug use: Never   Sexual activity: Not on file  Other Topics Concern   Not on file  Social History Narrative   ** Merged History Encounter **       Social Drivers of Corporate investment banker Strain: Not on file  Food Insecurity: Not on file  Transportation Needs: Not on file  Physical Activity: Not on file  Stress: Not on file  Social Connections: Not on file  Intimate Partner Violence: Not on file    Review of Systems:  All other review of systems negative except as mentioned in the HPI.  Physical Exam: Vital signs BP (!) 142/93   Pulse 77   Temp 98.4 F (36.9 C) (Temporal)   Ht 5\' 9"  (1.753 m)   Wt 229 lb (103.9 kg)   SpO2 96%   BMI 33.82 kg/m   General:   Alert,  Well-developed, well-nourished, pleasant and cooperative in NAD Airway:  Mallampati 2 Lungs:  Clear throughout to auscultation.   Heart:  Regular rate and rhythm; no murmurs, clicks, rubs,  or gallops. Abdomen:  Soft, nontender and nondistended. Normal bowel sounds.   Neuro/Psych:  Normal mood and affect. A and O x 3   Judithe Keetch E. Tomasa Rand, MD [facility]

## 2023-03-11 NOTE — Progress Notes (Signed)
Interpreter used today at the [facility] for this pt.  Interpreter's name is- Susy Frizzle

## 2023-03-11 NOTE — Progress Notes (Unsigned)
Sedate, gd SR, tolerated procedure well, VSS, report to RN 

## 2023-03-11 NOTE — Progress Notes (Unsigned)
Interpreter used today at the [facility] for this pt.  Interpreter's name is- Baron Sane

## 2023-03-11 NOTE — Patient Instructions (Addendum)
Recommendation:  - Patient has a contact number available for emergencies. The signs and symptoms of potential delayed complications were discussed with the patient. Return to normal activities tomorrow. Written discharge instructions were provided to the patient.  - Resume previous diet.  - Continue present medications.  - Repeat colonoscopy in 10 years for screening purposes.  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   YOU HAD AN ENDOSCOPIC PROCEDURE TODAY AT THE  ENDOSCOPY CENTER:   Refer to the procedure report that was given to you for any specific questions about what was found during the examination.  If the procedure report does not answer your questions, please call your gastroenterologist to clarify.  If you requested that your care partner not be given the details of your procedure findings, then the procedure report has been included in a sealed envelope for you to review at your convenience later.  YOU SHOULD EXPECT: Some feelings of bloating in the abdomen. Passage of more gas than usual.  Walking can help get rid of the air that was put into your GI tract during the procedure and reduce the bloating. If you had a lower endoscopy (such as a colonoscopy or flexible sigmoidoscopy) you may notice spotting of blood in your stool or on the toilet paper. If you underwent a bowel prep for your procedure, you may not have a normal bowel movement for a few days.  Please Note:  You might notice some irritation and congestion in your nose or some drainage.  This is from the oxygen used during your procedure.  There is no need for concern and it should clear up in a day or so.  SYMPTOMS TO REPORT IMMEDIATELY:  Following lower endoscopy (colonoscopy or flexible sigmoidoscopy):  Excessive amounts of blood in the stool  Significant tenderness or worsening of abdominal pains  Swelling of the abdomen that is new, acute  Fever of 100F or higher  For  urgent or emergent issues, a gastroenterologist can be reached at any hour by calling [phone]. Do not use MyChart messaging for urgent concerns.    DIET:  We do recommend a small meal at first, but then you may proceed to your regular diet.  Drink plenty of fluids but you should avoid alcoholic beverages for 24 hours.  MEDICATIONS: Continue present medications.  FOLLOW UP: Repeat colonoscopy in 10 years for screening purposes.  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  Thank you for allowing [place] to provide for your healthcare needs today.  ACTIVITY:  You should plan to take it easy for the rest of today and you should NOT DRIVE or use heavy machinery until tomorrow (because of the sedation medicines used during the test).    FOLLOW UP: Our staff will call the number listed on your records the next business day following your procedure.  We will call around 7:15- 8:00 am to check on you and address any questions or concerns that you may have regarding the information given to you following your procedure. If we do not reach you, we will leave a message.     If any biopsies were taken you will be contacted by phone or by letter within the next 1-3 weeks.  Please call us at [phone] if you have not heard about the biopsies in 3 weeks.    SIGNATURES/CONFIDENTIALITY: You and/or your care partner have signed paperwork which will be entered into your electronic medical record.  These signatures attest to the fact that that the information above on your After Visit Summary has been reviewed and is understood.  Full responsibility of the confidentiality of this discharge information lies with you and/or your care-partner.  USTED TUVO UN PROCEDIMIENTO ENDOSCPICO HOY EN EL [facility]:   Lea el informe del procedimiento que se le entreg para cualquier pregunta especfica sobre lo que se Dentist.  Si el informe del examen  no responde a sus preguntas, por favor llame a su gastroenterlogo para aclararlo.  Si usted solicit que no se le den [employer] de lo que se Clinical cytogeneticist en su procedimiento al [employer] va a cuidar, entonces el informe del procedimiento se ha incluido en un sobre sellado para que usted lo revise despus cuando le sea ms conveniente.   LO QUE PUEDE ESPERAR: Algunas sensaciones de hinchazn en el abdomen.  Puede tener ms gases de lo normal.  El caminar puede ayudarle a eliminar el aire que se le puso en el tracto gastrointestinal durante el procedimiento y reducir la hinchazn.  Si le hicieron una endoscopia inferior (como una colonoscopia o una sigmoidoscopia flexible), podra notar manchas de sangre en las heces fecales o en el papel higinico.  Si se someti a una preparacin intestinal para su procedimiento, es posible que no tenga una evacuacin intestinal normal durante [employer].   Tenga en cuenta:  Es posible que note un poco de irritacin y congestin en la nariz o algn drenaje.  Esto es debido al oxgeno [employer] durante su procedimiento.  No hay que preocuparse y esto debe desaparecer ms o Regulatory affairs officer.   SNTOMAS PARA REPORTAR INMEDIATAMENTE:  Despus de una endoscopia inferior (colonoscopia o sigmoidoscopia flexible):  Cantidades excesivas de sangre en las heces fecales  Sensibilidad significativa o empeoramiento de los dolores abdominales   Hinchazn aguda del abdomen que antes no tena   Fiebre de 100F o ms   Despus de la endoscopia superior (EGD)  Vmitos de Retail buyer o material como caf molido   Dolor en el pecho o dolor debajo de los omplatos que antes no tena   Dolor o dificultad persistente para tragar  Falta de aire que antes no tena   Fiebre de 100F o ms  Heces fecales negras y pegajosas   Para asuntos urgentes o de Associate Professor, puede comunicarse con un gastroenterlogo a cualquier hora llamando al [phone].  DIETA:  Recomendamos una comida  pequea al principio, pero luego puede continuar con su dieta normal.  Tome muchos lquidos, Tax adviser las bebidas alcohlicas durante 24 horas.    ACTIVIDAD:  Debe planear tomarse las cosas con calma por el resto del da y no debe CONDUCIR ni usar maquinaria pesada Patent examiner (debido a los medicamentos de sedacin utilizados durante el examen).     SEGUIMIENTO: Nuestro personal llamar al nmero que aparece en su historial al siguiente da hbil de su procedimiento para ver cmo se siente y para responder cualquier pregunta o inquietud que pueda tener con respecto a la informacin que se le dio despus del procedimiento. Si no podemos contactarle, le dejaremos un mensaje.  Sin embargo, si se siente bien y no tiene English as a second language teacher, no es necesario que nos devuelva la llamada.  Asumiremos que ha regresado a sus actividades diarias normales sin incidentes. Si se le tomaron algunas biopsias, le contactaremos por telfono o por carta en las prximas 3 semanas.  Si no ha sabido [employer]  biopsias en el transcurso de 3 semanas, por favor llmenos al [phone].   FIRMAS/CONFIDENCIALIDAD: Usted y/o el acompaante que le cuide han firmado documentos que se ingresarn en su historial mdico electrnico.  Estas firmas atestiguan el hecho de que la informacin anterior

## 2023-03-11 NOTE — Op Note (Signed)
[facility] Patient Name: Travis Ryan Procedure Date: 03/11/2023 4:16 PM MRN: [id number] Endoscopist: Lorin Picket E. Tomasa Rand , MD, [id number] Age: 47 Referring MD:  Date of Birth: July 18, 1975 Gender: Male Account #: [id number] Procedure:                Colonoscopy Indications:              Exclusion of Crohn's disease, Fistula in ano Medicines:                Monitored Anesthesia Care Procedure:                Pre-Anesthesia Assessment:                           - Prior to the procedure, a History and Physical                            was performed, and patient medications and                            allergies were reviewed. The patient's tolerance of                            previous anesthesia was also reviewed. The risks                            and benefits of the procedure and the sedation                            options and risks were discussed with the patient.                            All questions were answered, and informed consent                            was obtained. Prior Anticoagulants: The patient has                            taken no anticoagulant or antiplatelet agents. ASA                            Grade Assessment: II - A patient with mild systemic                            disease. After reviewing the risks and benefits,                            the patient was deemed in satisfactory condition to                            undergo the procedure.                           After obtaining informed consent, the colonoscope  was passed under direct vision. Throughout the                            procedure, the patient's blood pressure, pulse, and                            oxygen saturations were monitored continuously. The                            Olympus Scope SN [id number] was introduced through the                            anus and advanced to the the terminal ileum, with                             identification of the appendiceal orifice and IC                            valve. The colonoscopy was performed without                            difficulty. The patient tolerated the procedure                            well. The quality of the bowel preparation was                            good. The terminal ileum, ileocecal valve,                            appendiceal orifice, and rectum were photographed.                            The bowel preparation used was SUPREP via split                            dose instruction. Scope In: 4:27:15 PM Scope Out: 4:38:20 PM Scope Withdrawal Time: 0 hours 8 minutes 32 seconds  Total Procedure Duration: 0 hours 11 minutes 5 seconds  Findings:                 The perianal exam findings include 2                            setons/perianal fistulas along the left aspect of                            anus. No erythema, swelling or drainage.                           The digital rectal exam was normal. Pertinent                            negatives include normal sphincter tone and no  palpable rectal lesions.                           Many medium-mouthed and small-mouthed diverticula                            were found in the sigmoid colon and descending                            colon.                           The exam was otherwise normal throughout the                            examined colon.                           The terminal ileum appeared normal.                           The retroflexed view of the distal rectum and anal                            verge was normal and showed no anal or rectal                            abnormalities. Complications:            No immediate complications. Estimated Blood Loss:     Estimated blood loss: none. Impression:               - 2 setons along the left side found on perianal                            exam.                           - Moderate diverticulosis in  the sigmoid colon and                            in the descending colon.                           - The examined portion of the ileum was normal.                           - The distal rectum and anal verge are normal on                            retroflexion view.                           - No specimens collected.                           - No evidence of Crohn's disease Recommendation:           -  Patient has a contact number available for                            emergencies. The signs and symptoms of potential                            delayed complications were discussed with the                            patient. Return to normal activities tomorrow.                            Written discharge instructions were provided to the                            patient.                           - Resume previous diet.                           - Continue present medications.                           - Repeat colonoscopy in 10 years for screening                            purposes.                           - Follow up with Colorectal surgery for further                            managment of perianal fistulas. Nicolo Tomko E. Tomasa Rand, MD 03/11/2023 4:46:10 PM This report has been signed electronically.

## 2023-03-11 NOTE — Progress Notes (Unsigned)
Pt's states no medical or surgical changes since previsit or office visit. 

## 2023-03-12 ENCOUNTER — Telehealth: Payer: Self-pay

## 2023-03-12 NOTE — Telephone Encounter (Signed)
  Follow up Call-     03/11/2023    3:26 PM  Call back number  Post procedure Call Back phone  # [phone]  Permission to leave phone message Yes     Patient questions:  Do you have a fever, pain , or abdominal swelling? No. Pain Score  0 *  Have you tolerated food without any problems? Yes.    Have you been able to return to your normal activities? Yes.    Do you have any questions about your discharge instructions: Diet   No. Medications  No. Follow up visit  No.  Do you have questions or concerns about your Care? No.  Actions: * If pain score is 4 or above: No action needed, pain <4.

## 2023-05-18 ENCOUNTER — Encounter: Payer: Self-pay | Admitting: Emergency Medicine

## 2023-05-18 ENCOUNTER — Ambulatory Visit: Payer: No Typology Code available for payment source | Admitting: Emergency Medicine

## 2023-05-18 VITALS — BP 138/98 | HR 78 | Temp 98.4°F | Ht 69.0 in | Wt 231.0 lb

## 2023-05-18 DIAGNOSIS — I10 Essential (primary) hypertension: Secondary | ICD-10-CM | POA: Diagnosis not present

## 2023-05-18 LAB — LIPID PANEL
Cholesterol: 186 mg/dL (ref 0–200)
HDL: 42.4 mg/dL (ref >39.00)
LDL Cholesterol: 100 mg/dL — ABNORMAL HIGH (ref 0–99)
NonHDL: 143.36
Total CHOL/HDL Ratio: 4
Triglycerides: 216 mg/dL — ABNORMAL HIGH (ref 0.0–149.0)
VLDL: 43.2 mg/dL — ABNORMAL HIGH (ref 0.0–40.0)

## 2023-05-18 LAB — COMPREHENSIVE METABOLIC PANEL
ALT: 65 U/L — ABNORMAL HIGH (ref 0–53)
AST: 49 U/L — ABNORMAL HIGH (ref 0–37)
Albumin: 4.7 g/dL (ref 3.5–5.2)
Alkaline Phosphatase: 95 U/L (ref 39–117)
BUN: 16 mg/dL (ref 6–23)
CO2: 27 meq/L (ref 19–32)
Calcium: 9.5 mg/dL (ref 8.4–10.5)
Chloride: 106 meq/L (ref 96–112)
Creatinine, Ser: 0.81 mg/dL (ref 0.40–1.50)
GFR: 106.55 mL/min (ref >60.00)
Glucose, Bld: 116 mg/dL — ABNORMAL HIGH (ref 70–99)
Potassium: 4.2 meq/L (ref 3.5–5.1)
Sodium: 140 meq/L (ref 135–145)
Total Bilirubin: 0.5 mg/dL (ref 0.2–1.2)
Total Protein: 7.8 g/dL (ref 6.0–8.3)

## 2023-05-18 LAB — HEMOGLOBIN A1C: Hgb A1c MFr Bld: 7 % — ABNORMAL HIGH (ref 4.6–6.5)

## 2023-05-18 MED ORDER — LISINOPRIL 20 MG PO TABS: 20.0000 mg | ORAL_TABLET | Freq: Every day | ORAL | 3 refills | Status: DC

## 2023-05-18 NOTE — Assessment & Plan Note (Addendum)
 Cardiovascular risks associated with hypertension discussed Benefits of exercise discussed Diet approaches to stop hypertension discussed Blood work done today EKG done today.  Normal sinus rhythm with no signs of LVH or abnormal T waves. Recommend to start lisinopril 20 mg daily Advised to monitor blood pressure readings at home daily for the next several weeks and keep a log.  Advised to contact the office if numbers persistently abnormal Follow-up in 3 months

## 2023-05-18 NOTE — Progress Notes (Signed)
 Travis Ryan [place] 48 y.o.   Chief Complaint  Patient presents with   Hypertension    Patient states     HISTORY OF PRESENT ILLNESS: This is a 48 y.o. male concerned about blood pressure. BP Readings from Last 3 Encounters:  05/18/23 (!) 138/98  03/11/23 114/79  01/07/23 116/71     Hypertension Pertinent negatives include no chest pain, headaches, palpitations or shortness of breath.     Prior to Admission medications   Medication Sig Start Date End Date Taking? Authorizing Provider  acetaminophen (TYLENOL) 500 MG tablet Take 1,000 mg by mouth every 8 (eight) hours as needed for moderate pain.    [provider]  ibuprofen (ADVIL) 200 MG tablet Take 1,200 mg by mouth daily as needed for moderate pain.    [provider]  traMADol (ULTRAM) 50 MG tablet TAKE 1 TABLET BY MOUTH EVERY 6 HOURS AS NEEDED FOR FOR PAIN FOR UP TO 5 DAYS IF NOT RELIEVED BY TYLENOL OR IBUPROFEN FIRST 01/07/23   [provider]    No Known Allergies  There are no active problems to display for this patient.   Past Medical History:  Diagnosis Date   Anal fistula    Asthma    Perirectal abscess    Seasonal allergies     Past Surgical History:  Procedure Laterality Date   FISTULOTOMY N/A 05/15/2022   Procedure: CONTROL OF ANAL FISTULA/INTERROGATION WITH Partial FISTULOTOMY and SETON, POSSIBLE INCISION/DRAINAGE OF PERIRECTAL ABSCESS;  Surgeon: Andria Meuse, MD;  Location: [facility];  Service: General;  Laterality: N/A;   LIGATION OF INTERNAL FISTULA TRACT N/A 08/21/2022   Procedure: LIGATION OF INTERSPHINCTERIC FISTULA TRACT;  Surgeon: Andria Meuse, MD;  Location: [facility];  Service: General;  Laterality: N/A;   PLACEMENT OF SETON N/A 01/07/2023   Procedure: FISTULA TRANSSPHINCTERIC PLACEMENT OF SETON x2;  Surgeon: Andria Meuse, MD;  Location: [facility];  Service: General;  Laterality: N/A;   RECTAL EXAM UNDER  ANESTHESIA N/A 08/21/2022   Procedure: ANORECTAL EXAM UNDER ANESTHESIA;  Surgeon: Andria Meuse, MD;  Location: [facility];  Service: General;  Laterality: N/A;   RECTAL EXAM UNDER ANESTHESIA N/A 01/07/2023   Procedure: RECTAL EXAM UNDER ANESTHESIA;  Surgeon: Andria Meuse, MD;  Location: [facility];  Service: General;  Laterality: N/A;  62   UMBILICAL HERNIA REPAIR  2018    Social History   Socioeconomic History   Marital status: Single    Spouse name: Not on file   Number of children: 0   Years of education: Not on file   Highest education level: 12th grade  Occupational History   Occupation: floor install  Tobacco Use   Smoking status: Former    Current packs/day: 0.00    Types: Cigarettes    Start date: 2005    Quit date: 2010    Years since quitting: 15.1   Smokeless tobacco: Never  Vaping Use   Vaping status: Never Used  Substance and Sexual Activity   Alcohol use: Yes    Alcohol/week: 14.0 standard drinks of alcohol    Types: 14 Cans of beer per week    Comment: 1 to 2 beers per day   Drug use: Never   Sexual activity: Not on file  Other Topics Concern   Not on file  Social History Narrative   ** Merged History Encounter **       Social Drivers of Dispensing optician  Resource Strain: Low Risk  (05/14/2023)   Overall Financial Resource Strain (CARDIA)    Difficulty of Paying Living Expenses: Not hard at all  Food Insecurity: No Food Insecurity (05/14/2023)   Hunger Vital Sign    Worried About Running Out of Food in the Last Year: Never true    Ran Out of Food in the Last Year: Never true  Transportation Needs: Unmet Transportation Needs (05/14/2023)   PRAPARE - Transportation    Lack of Transportation (Medical): No    Lack of Transportation (Non-Medical): Yes  Physical Activity: Sufficiently Active (05/14/2023)   Exercise Vital Sign    Days of Exercise per Week: 5 days    Minutes of Exercise per Session: 60 min  Stress: Stress Concern  Present (05/14/2023)   [employer] of Occupational Health - Occupational Stress Questionnaire    Feeling of Stress : Very much  Social Connections: Moderately Integrated (05/14/2023)   Social Connection and Isolation Panel [NHANES]    Frequency of Communication with Friends and Family: More than three times a week    Frequency of Social Gatherings with Friends and Family: More than three times a week    Attends Religious Services: 1 to 4 times per year    Active Member of [employer] or Organizations: No    Attends Engineer, structural: Not on file    Marital Status: Living with partner  Intimate Partner Violence: Not on file    Family History  Problem Relation Age of Onset   Cancer Maternal Grandmother    Early death Maternal Grandfather    Cancer Paternal Grandmother    Early death Paternal Grandfather    Stomach cancer Neg Hx    Esophageal cancer Neg Hx    Colon cancer Neg Hx    Colon polyps Neg Hx    Rectal cancer Neg Hx      Review of Systems  Constitutional: Negative.  Negative for chills and fever.  HENT: Negative.  Negative for congestion and sore throat.   Respiratory: Negative.  Negative for cough and shortness of breath.   Cardiovascular: Negative.  Negative for chest pain and palpitations.  Gastrointestinal:  Negative for abdominal pain, diarrhea, nausea and vomiting.  Genitourinary: Negative.  Negative for dysuria and hematuria.  Skin: Negative.  Negative for rash.  Neurological: Negative.  Negative for dizziness and headaches.  All other systems reviewed and are negative.   Today's Vitals   05/18/23 1255  BP: (!) 138/98  Pulse: 78  Temp: 98.4 F (36.9 C)  TempSrc: Oral  SpO2: 98%  Weight: 231 lb (104.8 kg)  Height: 5\' 9"  (1.753 m)   Body mass index is 34.11 kg/m.   Physical Exam Vitals reviewed.  Constitutional:      Appearance: Normal appearance.  HENT:     Head: Normocephalic.     Mouth/Throat:     Mouth: Mucous membranes are moist.      Pharynx: Oropharynx is clear.  Eyes:     Extraocular Movements: Extraocular movements intact.     Pupils: Pupils are equal, round, and reactive to light.  Cardiovascular:     Rate and Rhythm: Normal rate and regular rhythm.     Pulses: Normal pulses.     Heart sounds: Normal heart sounds.  Pulmonary:     Effort: Pulmonary effort is normal.     Breath sounds: Normal breath sounds.  Skin:    General: Skin is warm and dry.  Neurological:     Mental Status: He is  alert and oriented to person, place, and time.  Psychiatric:        Mood and Affect: Mood normal.        Behavior: Behavior normal.    EKG: Normal sinus rhythm ventricular rate of 67..  No acute ischemic changes.  No evidence of LVH.  ASSESSMENT & PLAN: A total of 42 minutes was spent with the patient and counseling/coordination of care regarding preparing for this visit, review of most recent office visit notes, diagnosis of hypertension and cardiovascular risk associated with this condition, need to start antihypertensive medication, need for blood work today, review of most recent bloodwork results, review of health maintenance items, education on nutrition, prognosis, documentation, and need for follow up.  Problem List Items Addressed This Visit       Cardiovascular and Mediastinum   Essential hypertension - Primary   Cardiovascular risks associated with hypertension discussed Benefits of exercise discussed Diet approaches to stop hypertension discussed Blood work done today EKG done today Recommend to start lisinopril 20 mg daily Advised to monitor blood pressure readings at home daily for the next several weeks and keep a log.  Advised to contact the office if numbers persistently abnormal Follow-up in 3 months      Relevant Medications   lisinopril (ZESTRIL) 20 MG tablet   Other Relevant Orders   CBC with Differential/Platelet   Comprehensive metabolic panel   Hemoglobin A1c   Lipid panel   Patient  Instructions  Hipertensin en los adultos Hypertension, Adult El trmino hipertensin es otra forma de denominar a la presin arterial elevada. La presin arterial elevada fuerza al corazn a trabajar ms para bombear la sangre. Esto puede causar problemas con el paso del [place]. Una lectura de presin arterial est compuesta por 2 nmeros. Hay un nmero superior (sistlico) sobre un nmero inferior (diastlico). Lo ideal es tener la presin arterial por debajo de 120/80. Cules son las causas? Se desconoce la causa de esta afeccin. Algunas otras afecciones pueden provocar presin arterial elevada. Qu incrementa el riesgo? Algunos factores del estilo de vida pueden hacer que tenga ms probabilidades de desarrollar presin arterial elevada: Fumar. No hacer la cantidad suficiente de actividad fsica o ejercicio. Tener sobrepeso. Consumir mucha grasa, azcar, caloras o sal (sodio) en su dieta. Beber alcohol en exceso. Otros factores de riesgo son los siguientes: Tener alguna de estas afecciones: Enfermedad cardaca. Diabetes. Colesterol alto. Enfermedad renal. Apnea obstructiva del sueo. Tener antecedentes familiares de presin arterial elevada y colesterol elevado. Edad. El riesgo aumenta con la edad. Estrs. Cules son los signos o sntomas? Es posible que la presin arterial alta no cause sntomas. La presin arterial muy alta (crisis hipertensiva) puede provocar: Dolor de cabeza. Latidos cardacos acelerados o irregulares (palpitaciones). Falta de aire. Hemorragia nasal. Vomitar o sentir ganas de vomitar (nuseas). Cambios en la forma de ver. Dolor muy intenso en el pecho. Sensacin de [employer]. Convulsiones. Cmo se trata? Esta afeccin se trata haciendo cambios saludables en el estilo de vida, por ejemplo: Consumir alimentos saludables. Hacer ms ejercicio. Beber menos alcohol. El mdico puede recetarle medicamentos si los cambios en el estilo de vida no son lo  suficientemente eficaces y si: El nmero de arriba est por encima de 130. El nmero de abajo est por encima de 80. Su presin arterial personal ideal puede variar. Siga estas indicaciones en su casa: Comida y bebida  Si se lo dicen, siga el plan de alimentacin de DASH (Dietary Approaches to Stop Hypertension, Maneras de alimentarse para detener  la hipertensin). Para seguir este plan: Llene la mitad del plato de cada comida con frutas y verduras. Llene un cuarto del plato de cada comida con cereales integrales. Los cereales integrales incluyen pasta integral, arroz integral y pan integral. Coma y beba productos lcteos con bajo contenido de grasa, como leche descremada o yogur bajo en grasas. Llene un cuarto del plato de cada comida con protenas bajas en grasa (magras). Las protenas bajas en grasa incluyen pescado, pollo sin piel, huevos, frijoles y tofu. Evite consumir carne grasa, carne curada y procesada, o pollo con piel. Evite consumir alimentos prehechos o procesados. Limite la cantidad de sal en su dieta a menos de 1500 mg por da. No beba alcohol si: El mdico le indica que no lo haga. Est embarazada, puede estar embarazada o est tratando de [place]. Si bebe alcohol: Limite la cantidad que bebe a lo siguiente: De 0 a 1 medida por da para las mujeres. De 0 a 2 medidas por da para los hombres. Sepa cunta cantidad de alcohol hay en las bebidas que toma. En los [address], una medida equivale a una botella de cerveza de 12 oz (355 ml), un vaso de vino de 5 oz (148 ml) o un vaso de una bebida alcohlica de alta graduacin de 1 oz (44 ml). Estilo de vida  Trabaje con su mdico para mantenerse en un peso saludable o para perder peso. Pregntele a su mdico cul es el peso recomendable para usted. Realice al menos 30 minutos de ejercicio que haga que se acelere su corazn (ejercicio Magazine features editor) la [employer] de la [place]. Estos pueden incluir caminar, nadar o  andar en bicicleta. Realice al menos 30 minutos de ejercicio que fortalezca sus msculos (ejercicios de resistencia) al menos 3 das a la [place]. Estos pueden incluir levantar pesas o hacer Pilates. No fume ni consuma ningn producto que contenga nicotina o tabaco. Si necesita ayuda para dejar de consumir estos productos, consulte al mdico. Controle su presin arterial en su casa tal como le indic el mdico. Concurra a todas las visitas de seguimiento. Medicamentos Use los medicamentos de venta libre y los recetados solamente como se lo haya indicado el mdico. Siga cuidadosamente las indicaciones. No omita las dosis de medicamentos para la presin arterial. Los medicamentos pierden eficacia si omite dosis. El hecho de omitir las dosis tambin [place] el riesgo de otros problemas. Pregntele a su mdico a qu efectos secundarios o reacciones a los Museum/gallery curator. Comunquese con un mdico si: Piensa que tiene [place] reaccin a los medicamentos que est tomando. Tiene dolores de cabeza frecuentes. Siente mareos. Tiene hinchazn en los tobillos. Tiene problemas de visin. Solicite ayuda de inmediato si: Siente un dolor de cabeza muy intenso. Empieza a sentirse desorientado (confundido). Se siente dbil o adormecido. Siente que va a desmayarse. Tiene un dolor muy intenso en: Pecho. Vientre (abdomen). Vomita ms de una vez. Tiene dificultad para respirar. Estos sntomas pueden Customer service manager. Solicite ayuda de inmediato. Llame al 911. No espere a ver si los sntomas desaparecen. No conduzca por sus propios medios [employer]. Resumen El trmino hipertensin es otra forma de denominar a la presin arterial elevada. La presin arterial elevada fuerza al corazn a trabajar ms para bombear la sangre. Para la [employer], una presin arterial normal es menor que 120/80. Las decisiones saludables pueden ayudarle a disminuir su presin arterial. Si  no puede bajar su presin arterial mediante decisiones saludables, es posible  que deba tomar medicamentos. Esta informacin no tiene Theme park manager el consejo del mdico. Asegrese de hacerle al mdico cualquier pregunta que tenga. Document Revised: 01/17/2021 Document Reviewed: 01/17/2021 Elsevier Patient Education  2024 Elsevier Inc.      Edwina Barth, MD [facility]

## 2023-05-18 NOTE — Patient Instructions (Signed)
Hipertensin en los adultos Hypertension, Adult El trmino hipertensin es otra forma de denominar a la presin arterial elevada. La presin arterial elevada fuerza al corazn a trabajar ms para bombear la sangre. Esto puede causar problemas con el paso del tiempo. Una lectura de presin arterial est compuesta por 2 nmeros. Hay un nmero superior (sistlico) sobre un nmero inferior (diastlico). Lo ideal es tener la presin arterial por debajo de 120/80. Cules son las causas? Se desconoce la causa de esta afeccin. Algunas otras afecciones pueden provocar presin arterial elevada. Qu incrementa el riesgo? Algunos factores del estilo de vida pueden hacer que tenga ms probabilidades de desarrollar presin arterial elevada: Fumar. No hacer la cantidad suficiente de actividad fsica o ejercicio. Tener sobrepeso. Consumir mucha grasa, azcar, caloras o sal (sodio) en su dieta. Beber alcohol en exceso. Otros factores de riesgo son los siguientes: Tener alguna de estas afecciones: Enfermedad cardaca. Diabetes. Colesterol alto. Enfermedad renal. Apnea obstructiva del sueo. Tener antecedentes familiares de presin arterial elevada y colesterol elevado. Edad. El riesgo aumenta con la edad. Estrs. Cules son los signos o sntomas? Es posible que la presin arterial alta no cause sntomas. La presin arterial muy alta (crisis hipertensiva) puede provocar: Dolor de cabeza. Latidos cardacos acelerados o irregulares (palpitaciones). Falta de aire. Hemorragia nasal. Vomitar o sentir ganas de vomitar (nuseas). Cambios en la forma de ver. Dolor muy intenso en el pecho. Sensacin de mareo. Convulsiones. Cmo se trata? Esta afeccin se trata haciendo cambios saludables en el estilo de vida, por ejemplo: Consumir alimentos saludables. Hacer ms ejercicio. Beber menos alcohol. El mdico puede recetarle medicamentos si los cambios en el estilo de vida no son lo suficientemente  eficaces y si: El nmero de arriba est por encima de 130. El nmero de abajo est por encima de 80. Su presin arterial personal ideal puede variar. Siga estas indicaciones en su casa: Comida y bebida  Si se lo dicen, siga el plan de alimentacin de DASH (Dietary Approaches to Stop Hypertension, Maneras de alimentarse para detener la hipertensin). Para seguir este plan: Llene la mitad del plato de cada comida con frutas y verduras. Llene un cuarto del plato de cada comida con cereales integrales. Los cereales integrales incluyen pasta integral, arroz integral y pan integral. Coma y beba productos lcteos con bajo contenido de grasa, como leche descremada o yogur bajo en grasas. Llene un cuarto del plato de cada comida con protenas bajas en grasa (magras). Las protenas bajas en grasa incluyen pescado, pollo sin piel, huevos, frijoles y tofu. Evite consumir carne grasa, carne curada y procesada, o pollo con piel. Evite consumir alimentos prehechos o procesados. Limite la cantidad de sal en su dieta a menos de 1500 mg por da. No beba alcohol si: El mdico le indica que no lo haga. Est embarazada, puede estar embarazada o est tratando de quedar embarazada. Si bebe alcohol: Limite la cantidad que bebe a lo siguiente: De 0 a 1 medida por da para las mujeres. De 0 a 2 medidas por da para los hombres. Sepa cunta cantidad de alcohol hay en las bebidas que toma. En los [place], una medida equivale a una botella de cerveza de 12 oz (355 ml), un vaso de vino de 5 oz (148 ml) o un vaso de una bebida alcohlica de alta graduacin de 1 oz (44 ml). Estilo de vida  Trabaje con su mdico para mantenerse en un peso saludable o para perder peso. Pregntele a su mdico cul es el peso recomendable para   usted. Realice al menos 30 minutos de ejercicio que haga que se acelere su corazn (ejercicio aerbico) la mayora de los das de la semana. Estos pueden incluir caminar, nadar o andar en  bicicleta. Realice al menos 30 minutos de ejercicio que fortalezca sus msculos (ejercicios de resistencia) al menos 3 das a la semana. Estos pueden incluir levantar pesas o hacer Pilates. No fume ni consuma ningn producto que contenga nicotina o tabaco. Si necesita ayuda para dejar de consumir estos productos, consulte al mdico. Controle su presin arterial en su casa tal como le indic el mdico. Concurra a todas las visitas de seguimiento. Medicamentos Use los medicamentos de venta libre y los recetados solamente como se lo haya indicado el mdico. Siga cuidadosamente las indicaciones. No omita las dosis de medicamentos para la presin arterial. Los medicamentos pierden eficacia si omite dosis. El hecho de omitir las dosis tambin aumenta el riesgo de otros problemas. Pregntele a su mdico a qu efectos secundarios o reacciones a los medicamentos debe prestar atencin. Comunquese con un mdico si: Piensa que tiene una reaccin a los medicamentos que est tomando. Tiene dolores de cabeza frecuentes. Siente mareos. Tiene hinchazn en los tobillos. Tiene problemas de visin. Solicite ayuda de inmediato si: Siente un dolor de cabeza muy intenso. Empieza a sentirse desorientado (confundido). Se siente dbil o adormecido. Siente que va a desmayarse. Tiene un dolor muy intenso en: Pecho. Vientre (abdomen). Vomita ms de una vez. Tiene dificultad para respirar. Estos sntomas pueden indicar una emergencia. Solicite ayuda de inmediato. Llame al 911. No espere a ver si los sntomas desaparecen. No conduzca por sus propios medios hasta el hospital. Resumen El trmino hipertensin es otra forma de denominar a la presin arterial elevada. La presin arterial elevada fuerza al corazn a trabajar ms para bombear la sangre. Para la mayora de las personas, una presin arterial normal es menor que 120/80. Las decisiones saludables pueden ayudarle a disminuir su presin arterial. Si no puede  bajar su presin arterial mediante decisiones saludables, es posible que deba tomar medicamentos. Esta informacin no tiene como fin reemplazar el consejo del mdico. Asegrese de hacerle al mdico cualquier pregunta que tenga. Document Revised: 01/17/2021 Document Reviewed: 01/17/2021 Elsevier Patient Education  2024 Elsevier Inc.  

## 2023-05-19 ENCOUNTER — Encounter: Payer: Self-pay | Admitting: Emergency Medicine

## 2023-05-19 ENCOUNTER — Other Ambulatory Visit (INDEPENDENT_AMBULATORY_CARE_PROVIDER_SITE_OTHER): Payer: Self-pay | Admitting: Radiology

## 2023-05-19 DIAGNOSIS — I10 Essential (primary) hypertension: Secondary | ICD-10-CM | POA: Diagnosis not present

## 2023-05-19 LAB — CBC WITH DIFFERENTIAL/PLATELET
Basophils Absolute: 0.1 10*3/uL (ref 0.0–0.1)
Basophils Relative: 0.6 % (ref 0.0–3.0)
Eosinophils Absolute: 0.6 10*3/uL (ref 0.0–0.7)
Eosinophils Relative: 6.7 % — ABNORMAL HIGH (ref 0.0–5.0)
HCT: 44.4 % (ref 39.0–52.0)
Hemoglobin: 15.1 g/dL (ref 13.0–17.0)
Lymphocytes Relative: 30.2 % (ref 12.0–46.0)
Lymphs Abs: 2.8 10*3/uL (ref 0.7–4.0)
MCHC: 34.1 g/dL (ref 30.0–36.0)
MCV: 86.5 fL (ref 78.0–100.0)
Monocytes Absolute: 0.7 10*3/uL (ref 0.1–1.0)
Monocytes Relative: 7.8 % (ref 3.0–12.0)
Neutro Abs: 5.1 10*3/uL (ref 1.4–7.7)
Neutrophils Relative %: 54.7 % (ref 43.0–77.0)
Platelets: 190 10*3/uL (ref 150.0–400.0)
RBC: 5.13 Mil/uL (ref 4.22–5.81)
RDW: 12.8 % (ref 11.5–15.5)
WBC: 9.3 10*3/uL (ref 4.0–10.5)

## 2023-07-03 NOTE — Progress Notes (Signed)
 Surgery orders requested via Epic inbox.

## 2023-07-07 NOTE — Progress Notes (Signed)
 COVID Vaccine received:  []  No []  Yes Date of any COVID positive Test in last 90 days:  PCP - Maryagnes Small, MD Cardiologist -   Chest x-ray -  EKG -  05-18-2023  Epic Stress Test -  ECHO -  Cardiac Cath -   Bowel Prep - [x]  No  []   Yes ______ None per Alphonse Asal (07-07-23) no bowel prep or Fleet enemas written on scheduling sheet. No orders available at this time.   Pacemaker / ICD device []  No []  Yes   Spinal Cord Stimulator:[]  No []  Yes       History of Sleep Apnea? []  No []  Yes   CPAP used?- []  No []  Yes    Does the patient monitor blood sugar?   []  N/A   []  No []  Yes  Patient has: []  NO Hx DM   []  Pre-DM   []  DM1  [x]   DM2    on NO Medications for DM  Last A1c was: 7.0  on 05-18-23       Does patient have a [employer] or Dexcom? []  No []  Yes   Fasting Blood Sugar Ranges-  Checks Blood Sugar _____ times a day  Blood Thinner / Instructions:  none Aspirin Instructions:  none  ERAS Protocol Ordered: []  No  []  Yes PRE-SURGERY []  ENSURE  []  G2   []  No Drink Ordered  Patient is to be NPO after:    no orders  Dental hx: []  Dentures:  []  N/A      []  Bridge or Partial:                   []  Loose or Damaged teeth:   Comments:   Activity level: Patient is able / unable to climb a flight of stairs without difficulty; []  No CP  []  No SOB, but would have ___   Patient can / can not perform ADLs without assistance.   Anesthesia review: HTN- no meds, DM2 -No Meds, ETOH- ?LFTs,   Patient denies shortness of breath, fever, cough and chest pain at PAT appointment.  Patient verbalized understanding and agreement to the Pre-Surgical Instructions that were given to them at this PAT appointment. Patient was also educated of the need to review these PAT instructions again prior to his surgery.I reviewed the appropriate phone numbers to call if they have any and questions or concerns.

## 2023-07-07 NOTE — Patient Instructions (Signed)
 SURGICAL WAITING ROOM VISITATION Patients having surgery or a procedure may have no more than 2 support people in the waiting area - these visitors may rotate in the visitor waiting room.   If the patient needs to stay at the hospital during part of their recovery, the visitor guidelines for inpatient rooms apply.  PRE-OP VISITATION  Pre-op nurse will coordinate an appropriate time for 1 support person to accompany the patient in pre-op.  This support person may not rotate.  This visitor will be contacted when the time is appropriate for the visitor to come back in the pre-op area.  Please refer to the [facility] website for the visitor guidelines for Inpatients (after your surgery is over and you are in a regular room).  You are not required to quarantine at this time prior to your surgery. However, you must do this: Hand Hygiene often Do NOT share personal items Notify your provider if you are in close contact with someone who has COVID or you develop fever 100.4 or greater, new onset of sneezing, cough, sore throat, shortness of breath or body aches.  If you test positive for Covid or have been in contact with anyone that has tested positive in the last 10 days please notify you surgeon.    Your procedure is scheduled on: Monday  July 13, 2023   Report to [facility] Main Entrance: [address] entrance where the [employer] is available.   Report to admitting at: 05:15 AM  Call this number if you have any questions or problems the morning of surgery [phone]  DO NOT EAT OR DRINK ANYTHING AFTER MIDNIGHT THE NIGHT PRIOR TO YOUR SURGERY / PROCEDURE.   FOLLOW  ANY ADDITIONAL PRE OP INSTRUCTIONS YOU RECEIVED FROM YOUR SURGEON'S OFFICE!!!   Oral Hygiene is also important to reduce your risk of infection.        Remember - BRUSH YOUR TEETH THE MORNING OF SURGERY WITH YOUR REGULAR TOOTHPASTE  Do NOT smoke after Midnight the night before surgery.  STOP TAKING all Vitamins,  Herbs and supplements 1 week before your surgery.   Take ONLY these medicines the morning of surgery with A SIP OF WATER: Tylenol if needed for pain. You may use your Flonase Nasal spray if needed.    You may not have any metal on your body including , jewelry, and body piercing  Do not wear lotions, powders, cologne, or deodorant  Men may shave face and neck.  Contacts, Hearing Aids, dentures or bridgework may not be worn into surgery. DENTURES WILL BE REMOVED PRIOR TO SURGERY PLEASE DO NOT APPLY "Poly grip" OR ADHESIVES!!!  Patients discharged on the day of surgery will not be allowed to drive home.  Someone NEEDS to stay with you for the first 24 hours after anesthesia.  Do not bring your home medications to the hospital. The Pharmacy will dispense medications listed on your medication list to you during your admission in the Hospital.  Please read over the following fact sheets you were given: IF YOU HAVE QUESTIONS ABOUT YOUR PRE-OP INSTRUCTIONS, PLEASE CALL [phone].   [place] - Preparing for Surgery Before surgery, you can play an important role.  Because skin is not sterile, your skin needs to be as free of germs as possible.  You can reduce the number of germs on your skin by washing with CHG (chlorahexidine gluconate) soap before surgery.  CHG is an antiseptic cleaner which kills germs and bonds with the skin to continue  killing germs even after washing. Please DO NOT use if you have an allergy to CHG or antibacterial soaps.  If your skin becomes reddened/irritated stop using the CHG and inform your nurse when you arrive at Short Stay. Do not shave (including legs and underarms) for at least 48 hours prior to the first CHG shower.  You may shave your face/neck.  Please follow these instructions carefully:  1.  Shower with CHG Soap the night before surgery and the  morning of surgery.  2.  If you choose to wash your hair, wash your hair first as usual with your normal   shampoo.  3.  After you shampoo, rinse your hair and body thoroughly to remove the shampoo.                             4.  Use CHG as you would any other liquid soap.  You can apply chg directly to the skin and wash.  Gently with a scrungie or clean washcloth.  5.  Apply the CHG Soap to your body ONLY FROM THE NECK DOWN.   Do not use on face/ open                           Wound or open sores. Avoid contact with eyes, ears mouth and genitals (private parts).                       Wash face,  Genitals (private parts) with your normal soap.             6.  Wash thoroughly, paying special attention to the area where your  surgery  will be performed.  7.  Thoroughly rinse your body with warm water from the neck down.  8.  DO NOT shower/wash with your normal soap after using and rinsing off the CHG Soap.            9.  Pat yourself dry with a clean towel.            10.  Wear clean pajamas.            11.  Place clean sheets on your bed the night of your first shower and do not  sleep with pets.  ON THE DAY OF SURGERY : Do not apply any lotions/deodorants the morning of surgery.  Please wear clean clothes to the hospital/surgery center.    FAILURE TO FOLLOW THESE INSTRUCTIONS MAY RESULT IN THE CANCELLATION OF YOUR SURGERY  PATIENT SIGNATURE_________________________________  NURSE SIGNATURE__________________________________  ________________________________________________________________________

## 2023-07-08 ENCOUNTER — Encounter (HOSPITAL_COMMUNITY): Payer: Self-pay

## 2023-07-08 ENCOUNTER — Encounter (HOSPITAL_COMMUNITY)
Admission: RE | Admit: 2023-07-08 | Discharge: 2023-07-08 | Disposition: A | Discharge status: Home / Self Care | Source: Ambulatory Visit | Attending: Surgery | Admitting: Surgery

## 2023-07-08 ENCOUNTER — Other Ambulatory Visit: Payer: Self-pay

## 2023-07-08 VITALS — BP 136/104 | HR 74 | Temp 98.2°F | Resp 16 | Ht 70.0 in | Wt 226.0 lb

## 2023-07-08 DIAGNOSIS — Z01818 Encounter for other preprocedural examination: Secondary | ICD-10-CM

## 2023-07-08 DIAGNOSIS — I10 Essential (primary) hypertension: Secondary | ICD-10-CM | POA: Diagnosis not present

## 2023-07-08 DIAGNOSIS — Z01812 Encounter for preprocedural laboratory examination: Secondary | ICD-10-CM | POA: Insufficient documentation

## 2023-07-08 DIAGNOSIS — F1090 Alcohol use, unspecified, uncomplicated: Secondary | ICD-10-CM | POA: Insufficient documentation

## 2023-07-08 DIAGNOSIS — E119 Type 2 diabetes mellitus without complications: Secondary | ICD-10-CM | POA: Insufficient documentation

## 2023-07-08 HISTORY — DX: Type 2 diabetes mellitus without complications: E11.9

## 2023-07-08 HISTORY — DX: Essential (primary) hypertension: I10

## 2023-07-08 LAB — CBC
HCT: 45.2 % (ref 39.0–52.0)
Hemoglobin: 15.3 g/dL (ref 13.0–17.0)
MCH: 29.3 pg (ref 26.0–34.0)
MCHC: 33.8 g/dL (ref 30.0–36.0)
MCV: 86.6 fL (ref 80.0–100.0)
Platelets: 213 10*3/uL (ref 150–400)
RBC: 5.22 MIL/uL (ref 4.22–5.81)
RDW: 12 % (ref 11.5–15.5)
WBC: 9.1 10*3/uL (ref 4.0–10.5)
nRBC: 0 % (ref 0.0–0.2)

## 2023-07-08 LAB — COMPREHENSIVE METABOLIC PANEL WITH GFR
ALT: 74 U/L — ABNORMAL HIGH (ref 0–44)
AST: 55 U/L — ABNORMAL HIGH (ref 15–41)
Albumin: 4 g/dL (ref 3.5–5.0)
Alkaline Phosphatase: 96 U/L (ref 38–126)
Anion gap: 9 (ref 5–15)
BUN: 14 mg/dL (ref 6–20)
CO2: 22 mmol/L (ref 22–32)
Calcium: 9 mg/dL (ref 8.9–10.3)
Chloride: 107 mmol/L (ref 98–111)
Creatinine, Ser: 0.73 mg/dL (ref 0.61–1.24)
GFR, Estimated: >60 mL/min (ref >60)
Glucose, Bld: 180 mg/dL — ABNORMAL HIGH (ref 70–99)
Potassium: 3.8 mmol/L (ref 3.5–5.1)
Sodium: 138 mmol/L (ref 135–145)
Total Bilirubin: 0.9 mg/dL (ref 0.0–1.2)
Total Protein: 7.4 g/dL (ref 6.5–8.1)

## 2023-07-08 LAB — GLUCOSE, CAPILLARY: Glucose-Capillary: 194 mg/dL — ABNORMAL HIGH (ref 70–99)

## 2023-07-08 NOTE — Progress Notes (Addendum)
 Activity -  Pt is able to climb a flight of stairs without CP or SOB. No Cardiologist Does not check BS at home  Pt. BP elevated at preop 136/104 / 149/101 recheck.   Camilo Cella Ward PA-C aware . Pt. Advised to monitor at home. If BP remains elevated reach out to PCP or UC to initiate BP med. Pt. Also aware Diastolic above 100 could cause cancellation of surgery. He is also trying to avoid sodium and sweets per his PCP advice.

## 2023-07-12 NOTE — Anesthesia Preprocedure Evaluation (Signed)
 Anesthesia Evaluation  Patient identified by MRN, date of birth, ID band Patient awake    Reviewed: Allergy & Precautions, NPO status , Patient's Chart, lab work & pertinent test results  Airway Mallampati: II  TM Distance: >3 FB Neck ROM: Full    Dental  (+) Dental Advisory Given, Missing,    Pulmonary asthma , former smoker   Pulmonary exam normal breath sounds clear to auscultation       Cardiovascular hypertension, Pt. on medications Normal cardiovascular exam Rhythm:Regular Rate:Normal     Neuro/Psych negative neurological ROS  negative psych ROS   GI/Hepatic Neg liver ROS,,,transsphincteric anal fistula   Endo/Other  diabetes, Type 2  Obesity   Renal/GU negative Renal ROS     Musculoskeletal negative musculoskeletal ROS (+)    Abdominal   Peds  Hematology negative hematology ROS (+)   Anesthesia Other Findings   Reproductive/Obstetrics                             Anesthesia Physical Anesthesia Plan  ASA: 2  Anesthesia Plan: General   Post-op Pain Management: Tylenol  PO (pre-op)* and Toradol  IV (intra-op)*   Induction: Intravenous  PONV Risk Score and Plan: 2 and Midazolam , Dexamethasone  and Ondansetron   Airway Management Planned: Oral ETT  Additional Equipment:   Intra-op Plan:   Post-operative Plan: Extubation in OR  Informed Consent: I have reviewed the patients History and Physical, chart, labs and discussed the procedure including the risks, benefits and alternatives for the proposed anesthesia with the patient or authorized representative who has indicated his/her understanding and acceptance.     Dental advisory given and Interpreter used for interview  Plan Discussed with: CRNA  Anesthesia Plan Comments:        Anesthesia Quick Evaluation

## 2023-07-13 ENCOUNTER — Encounter (HOSPITAL_COMMUNITY): Payer: Self-pay | Admitting: Surgery

## 2023-07-13 ENCOUNTER — Other Ambulatory Visit: Payer: Self-pay

## 2023-07-13 ENCOUNTER — Ambulatory Visit (HOSPITAL_BASED_OUTPATIENT_CLINIC_OR_DEPARTMENT_OTHER): Payer: Self-pay | Admitting: Certified Registered"

## 2023-07-13 ENCOUNTER — Ambulatory Visit (HOSPITAL_COMMUNITY): Payer: Self-pay | Admitting: Physician Assistant

## 2023-07-13 ENCOUNTER — Encounter (HOSPITAL_COMMUNITY)
Admission: RE | Disposition: A | Discharge status: Home / Self Care | Payer: Self-pay | Source: Home / Self Care | Attending: Surgery

## 2023-07-13 ENCOUNTER — Ambulatory Visit (HOSPITAL_COMMUNITY)
Admission: RE | Admit: 2023-07-13 | Discharge: 2023-07-13 | Disposition: A | Discharge status: Home / Self Care | Attending: Surgery | Admitting: Surgery

## 2023-07-13 DIAGNOSIS — Z6832 Body mass index (BMI) 32.0-32.9, adult: Secondary | ICD-10-CM | POA: Diagnosis not present

## 2023-07-13 DIAGNOSIS — J45909 Unspecified asthma, uncomplicated: Secondary | ICD-10-CM | POA: Insufficient documentation

## 2023-07-13 DIAGNOSIS — Z87891 Personal history of nicotine dependence: Secondary | ICD-10-CM | POA: Diagnosis not present

## 2023-07-13 DIAGNOSIS — K603 Anal fistula, unspecified: Secondary | ICD-10-CM

## 2023-07-13 DIAGNOSIS — E669 Obesity, unspecified: Secondary | ICD-10-CM | POA: Diagnosis not present

## 2023-07-13 DIAGNOSIS — Z01818 Encounter for other preprocedural examination: Secondary | ICD-10-CM

## 2023-07-13 DIAGNOSIS — K60329 Anal fistula, complex, unspecified: Secondary | ICD-10-CM | POA: Diagnosis not present

## 2023-07-13 DIAGNOSIS — I10 Essential (primary) hypertension: Secondary | ICD-10-CM | POA: Insufficient documentation

## 2023-07-13 DIAGNOSIS — F1090 Alcohol use, unspecified, uncomplicated: Secondary | ICD-10-CM

## 2023-07-13 DIAGNOSIS — E119 Type 2 diabetes mellitus without complications: Secondary | ICD-10-CM | POA: Diagnosis not present

## 2023-07-13 HISTORY — PX: MUCOSAL ADVANCEMENT FLAP: SHX6479

## 2023-07-13 HISTORY — PX: RECTAL EXAM UNDER ANESTHESIA: SHX6399

## 2023-07-13 LAB — GLUCOSE, CAPILLARY
Glucose-Capillary: 137 mg/dL — ABNORMAL HIGH (ref 70–99)
Glucose-Capillary: 137 mg/dL — ABNORMAL HIGH (ref 70–99)

## 2023-07-13 SURGERY — CREATION, FLAP, MUCOSAL ADVANCEMENT
Anesthesia: General

## 2023-07-13 MED ORDER — SODIUM CHLORIDE 0.9 % IV SOLN
2.0000 g | Freq: Once | INTRAVENOUS | Status: AC
  Administered 2023-07-13: 2 g via INTRAVENOUS
  Filled 2023-07-13 (×2): qty 2

## 2023-07-13 MED ORDER — BUPIVACAINE LIPOSOME 1.3 % IJ SUSP
INTRAMUSCULAR | Status: DC | PRN
  Administered 2023-07-13: 20 mL

## 2023-07-13 MED ORDER — PROPOFOL 10 MG/ML IV BOLUS
INTRAVENOUS | Status: AC
  Filled 2023-07-13: qty 20

## 2023-07-13 MED ORDER — TRAMADOL HCL 50 MG PO TABS: 50.0000 mg | ORAL_TABLET | Freq: Four times a day (QID) | ORAL | 0 refills | Status: AC | PRN

## 2023-07-13 MED ORDER — LACTATED RINGERS IV SOLN: INTRAVENOUS | Status: DC | PRN

## 2023-07-13 MED ORDER — DEXAMETHASONE SODIUM PHOSPHATE 10 MG/ML IJ SOLN
INTRAMUSCULAR | Status: DC | PRN
  Administered 2023-07-13: 5 mg via INTRAVENOUS

## 2023-07-13 MED ORDER — ONDANSETRON HCL 4 MG/2ML IJ SOLN
INTRAMUSCULAR | Status: DC | PRN
  Administered 2023-07-13: 4 mg via INTRAVENOUS

## 2023-07-13 MED ORDER — ROCURONIUM BROMIDE 100 MG/10ML IV SOLN
INTRAVENOUS | Status: DC | PRN
  Administered 2023-07-13: 60 mg via INTRAVENOUS

## 2023-07-13 MED ORDER — BUPIVACAINE-EPINEPHRINE (PF) 0.25% -1:200000 IJ SOLN
INTRAMUSCULAR | Status: DC | PRN
  Administered 2023-07-13: 30 mL

## 2023-07-13 MED ORDER — 0.9 % SODIUM CHLORIDE (POUR BTL) OPTIME
TOPICAL | Status: DC | PRN
  Administered 2023-07-13: 1000 mL

## 2023-07-13 MED ORDER — MIDAZOLAM HCL 5 MG/5ML IJ SOLN
INTRAMUSCULAR | Status: DC | PRN
  Administered 2023-07-13: 2 mg via INTRAVENOUS

## 2023-07-13 MED ORDER — ONDANSETRON HCL 4 MG/2ML IJ SOLN
INTRAMUSCULAR | Status: AC
  Filled 2023-07-13: qty 2

## 2023-07-13 MED ORDER — LIDOCAINE HCL (CARDIAC) PF 100 MG/5ML IV SOSY
PREFILLED_SYRINGE | INTRAVENOUS | Status: DC | PRN
  Administered 2023-07-13: 80 mg via INTRAVENOUS

## 2023-07-13 MED ORDER — BUPIVACAINE LIPOSOME 1.3 % IJ SUSP
INTRAMUSCULAR | Status: AC
  Filled 2023-07-13: qty 20

## 2023-07-13 MED ORDER — ONDANSETRON HCL 4 MG/2ML IJ SOLN: 4.0000 mg | Freq: Once | INTRAMUSCULAR | Status: DC | PRN

## 2023-07-13 MED ORDER — FENTANYL CITRATE (PF) 100 MCG/2ML IJ SOLN
INTRAMUSCULAR | Status: AC
  Filled 2023-07-13: qty 2

## 2023-07-13 MED ORDER — BUPIVACAINE-EPINEPHRINE (PF) 0.25% -1:200000 IJ SOLN
INTRAMUSCULAR | Status: AC
  Filled 2023-07-13: qty 30

## 2023-07-13 MED ORDER — DEXAMETHASONE SODIUM PHOSPHATE 10 MG/ML IJ SOLN
INTRAMUSCULAR | Status: AC
  Filled 2023-07-13: qty 1

## 2023-07-13 MED ORDER — METHYLENE BLUE (ANTIDOTE) 1 % IV SOLN
INTRAVENOUS | Status: AC
  Filled 2023-07-13: qty 10

## 2023-07-13 MED ORDER — AMISULPRIDE (ANTIEMETIC) 5 MG/2ML IV SOLN
10.0000 mg | Freq: Once | INTRAVENOUS | Status: AC | PRN
  Administered 2023-07-13: 10 mg via INTRAVENOUS

## 2023-07-13 MED ORDER — CHLORHEXIDINE GLUCONATE 0.12 % MT SOLN
15.0000 mL | Freq: Once | OROMUCOSAL | Status: AC
  Administered 2023-07-13: 15 mL via OROMUCOSAL

## 2023-07-13 MED ORDER — FENTANYL CITRATE (PF) 100 MCG/2ML IJ SOLN
INTRAMUSCULAR | Status: DC | PRN
  Administered 2023-07-13: 25 ug via INTRAVENOUS
  Administered 2023-07-13: 50 ug via INTRAVENOUS
  Administered 2023-07-13: 25 ug via INTRAVENOUS
  Administered 2023-07-13 (×2): 50 ug via INTRAVENOUS

## 2023-07-13 MED ORDER — FENTANYL CITRATE PF 50 MCG/ML IJ SOSY: 25.0000 ug | PREFILLED_SYRINGE | INTRAMUSCULAR | Status: DC | PRN

## 2023-07-13 MED ORDER — DIBUCAINE (PERIANAL) 1 % EX OINT
TOPICAL_OINTMENT | CUTANEOUS | Status: AC
  Filled 2023-07-13: qty 28

## 2023-07-13 MED ORDER — DEXMEDETOMIDINE HCL IN NACL 80 MCG/20ML IV SOLN
INTRAVENOUS | Status: AC
  Filled 2023-07-13: qty 20

## 2023-07-13 MED ORDER — INSULIN ASPART 100 UNIT/ML IJ SOLN: 0.0000 [IU] | INTRAMUSCULAR | Status: DC | PRN

## 2023-07-13 MED ORDER — ROCURONIUM BROMIDE 10 MG/ML (PF) SYRINGE
PREFILLED_SYRINGE | INTRAVENOUS | Status: AC
  Filled 2023-07-13: qty 10

## 2023-07-13 MED ORDER — PROPOFOL 10 MG/ML IV BOLUS
INTRAVENOUS | Status: DC | PRN
  Administered 2023-07-13: 170 mg via INTRAVENOUS

## 2023-07-13 MED ORDER — LIDOCAINE HCL (PF) 2 % IJ SOLN
INTRAMUSCULAR | Status: AC
  Filled 2023-07-13: qty 5

## 2023-07-13 MED ORDER — SODIUM CHLORIDE 0.9 % IV SOLN: 2.0000 g | Freq: Once | INTRAVENOUS | Status: DC

## 2023-07-13 MED ORDER — AMISULPRIDE (ANTIEMETIC) 5 MG/2ML IV SOLN
INTRAVENOUS | Status: AC
  Filled 2023-07-13: qty 2

## 2023-07-13 MED ORDER — ORAL CARE MOUTH RINSE: 15.0000 mL | Freq: Once | OROMUCOSAL | Status: AC

## 2023-07-13 MED ORDER — PHENYLEPHRINE 80 MCG/ML (10ML) SYRINGE FOR IV PUSH (FOR BLOOD PRESSURE SUPPORT)
PREFILLED_SYRINGE | INTRAVENOUS | Status: DC | PRN
  Administered 2023-07-13 (×2): 160 ug via INTRAVENOUS
  Administered 2023-07-13 (×3): 80 ug via INTRAVENOUS

## 2023-07-13 MED ORDER — DEXMEDETOMIDINE HCL IN NACL 80 MCG/20ML IV SOLN
INTRAVENOUS | Status: DC | PRN
  Administered 2023-07-13: 4 ug via INTRAVENOUS
  Administered 2023-07-13 (×2): 8 ug via INTRAVENOUS
  Administered 2023-07-13: 4 ug via INTRAVENOUS

## 2023-07-13 MED ORDER — SUGAMMADEX SODIUM 200 MG/2ML IV SOLN
INTRAVENOUS | Status: DC | PRN
Start: 2023-07-13 — End: 2023-07-13
  Administered 2023-07-13: 100 mg via INTRAVENOUS
  Administered 2023-07-13: 200 mg via INTRAVENOUS

## 2023-07-13 MED ORDER — MIDAZOLAM HCL 2 MG/2ML IJ SOLN
INTRAMUSCULAR | Status: AC
  Filled 2023-07-13: qty 2

## 2023-07-13 MED ORDER — LACTATED RINGERS IV SOLN: INTRAVENOUS | Status: DC

## 2023-07-13 SURGICAL SUPPLY — 38 items
BAG COUNTER SPONGE SURGICOUNT (BAG) IMPLANT
BENZOIN TINCTURE PRP APPL 2/3 (GAUZE/BANDAGES/DRESSINGS) IMPLANT
BLADE SURG 15 STRL LF DISP TIS (BLADE) IMPLANT
BRIEF MESH DISP LRG (UNDERPADS AND DIAPERS) ×2 IMPLANT
COVER SURGICAL LIGHT HANDLE (MISCELLANEOUS) ×2 IMPLANT
DISSECTOR SURG LIGASURE 21 (MISCELLANEOUS) IMPLANT
DRAPE LAPAROTOMY T 102X78X121 (DRAPES) ×2 IMPLANT
ELECT NDL TIP 2.8 STRL (NEEDLE) ×2 IMPLANT
ELECT NEEDLE TIP 2.8 STRL (NEEDLE) ×1 IMPLANT
ELECT PENCIL ROCKER SW 15FT (MISCELLANEOUS) IMPLANT
ELECT REM PT RETURN 15FT ADLT (MISCELLANEOUS) ×2 IMPLANT
ELECTRODE CAUTERY BLDE TIP 2.5 (TIP) IMPLANT
GAUZE 4X4 16PLY ~~LOC~~+RFID DBL (SPONGE) ×2 IMPLANT
GAUZE PAD ABD 8X10 STRL (GAUZE/BANDAGES/DRESSINGS) IMPLANT
GAUZE SPONGE 4X4 12PLY STRL (GAUZE/BANDAGES/DRESSINGS) IMPLANT
GLOVE BIO SURGEON STRL SZ7.5 (GLOVE) ×2 IMPLANT
GLOVE INDICATOR 8.0 STRL GRN (GLOVE) ×2 IMPLANT
GOWN STRL REUS W/ TWL XL LVL3 (GOWN DISPOSABLE) ×2 IMPLANT
KIT BASIN OR (CUSTOM PROCEDURE TRAY) ×2 IMPLANT
KIT TURNOVER KIT A (KITS) IMPLANT
LOOP VESSEL MAXI BLUE (MISCELLANEOUS) IMPLANT
NDL HYPO 22X1.5 SAFETY MO (MISCELLANEOUS) ×2 IMPLANT
NEEDLE HYPO 22X1.5 SAFETY MO (MISCELLANEOUS) ×1 IMPLANT
PACK BASIC VI WITH GOWN DISP (CUSTOM PROCEDURE TRAY) ×2 IMPLANT
PAK SCROTO (SET/KITS/TRAYS/PACK) IMPLANT
RETRACTOR RING URO 16.6X16.6 (MISCELLANEOUS) IMPLANT
RETRACTOR STAY HOOK 5MM (MISCELLANEOUS) IMPLANT
SHEARS HARMONIC 9CM CVD (BLADE) IMPLANT
SPIKE FLUID TRANSFER (MISCELLANEOUS) ×2 IMPLANT
SPONGE HEMORRHOID 8X3CM (HEMOSTASIS) IMPLANT
SURGILUBE 2OZ TUBE FLIPTOP (MISCELLANEOUS) ×2 IMPLANT
SUT CHROMIC 2 0 SH (SUTURE) IMPLANT
SUT CHROMIC 3 0 SH 27 (SUTURE) IMPLANT
SUT VIC AB 2-0 SH 27X BRD (SUTURE) ×4 IMPLANT
SUT VIC AB 2-0 UR6 27 (SUTURE) IMPLANT
SUT VIC AB 3-0 SH 18 (SUTURE) IMPLANT
SYR 20ML LL LF (SYRINGE) ×2 IMPLANT
TOWEL OR 17X26 10 PK STRL BLUE (TOWEL DISPOSABLE) ×2 IMPLANT

## 2023-07-13 NOTE — Discharge Instructions (Addendum)

## 2023-07-13 NOTE — H&P (Signed)
 CC: Here today for surgery  HPI: Travis Ryan is an 48 y.o. male with history of seasonal allergies and prior perianal abscesses, whom is seen in the office today for evaluation. He was seen last week in our urgent office with Puja.  First developed some discomfort/perianal pain back in October, 2023. He was found to have a left perianal abscess and suspected anal fissure. He was given Augmentin , nifedipine  cream, and Anusol  suppositories. He was subsequently referred to see GI-Dr. Cherryl Corona and colorectal surgery but did not make an appointment at that time due to lack of insurance. He had persistent to some degree perianal pain in the interim. He notes some minor tight pressure in the perianal area. Some yellowish drainage occasionally as well. Will wear pads to control the amount of drainage she has had in the past.  Denies ever having had a colonoscopy. 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 He underwent incision/drainage in the office. He was subsequently scheduled to see us  for follow-up. He denies any history of any prior anal abscesses, swelling, or pain prior to October, 2023.  Overall since having an I&D last week, reports all of his symptoms have improved. Currently no significant perianal pain. Scant drainage. No fever or chills.  He is fluent in [place] and was offered but declined medical interpreter.  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 Cherryl Corona 03/11/23 -diverticulosis in sigmoid and descending; examined portion of terminal ileum was normal; distal rectum and anal verge are normal on retroflexion; no evidence of Crohn's disease  He has been doing well. He has no complaints today. Setons remain in place. Scant drainage. No recurrent abscesses or significant perianal pain.   INTERVAL HX He denies any changes in health or health history since we met in the office. No new medications/allergies. He states he is ready for surgery today. Spanish interpreter present at bedside for his family today.  PMH: Seasonal allergies  PSH: I&D of perianal abscess x1 as above  FHx: Denies any known family history of colorectal, breast, endometrial or ovarian cancer  Social Hx: Drinks ~1 beer per day. Denies use of tobacco/illicit drug. 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    Past Medical History:  Diagnosis Date   Anal fistula    Asthma    Seasonal   Diabetes mellitus without complication (HCC)    no meds hgbA1c 7.0 05-18-23   Hypertension    no meds think it is pain related   Perirectal abscess    Seasonal allergies     Past Surgical History:  Procedure Laterality Date   FISTULOTOMY N/A 05/15/2022   Procedure: CONTROL OF ANAL FISTULA/INTERROGATION WITH Partial FISTULOTOMY and SETON, POSSIBLE INCISION/DRAINAGE OF PERIRECTAL ABSCESS;  Surgeon: Melvenia Stabs, MD;  Location:  [facility];  Service: General;  Laterality: N/A;   INGUINAL HERNIA REPAIR Left    2018   LIGATION OF INTERNAL FISTULA TRACT N/A 08/21/2022   Procedure: LIGATION OF INTERSPHINCTERIC FISTULA TRACT;  Surgeon: Melvenia Stabs, MD;  Location: [facility] ;  Service:  General;  Laterality: N/A;   PLACEMENT OF SETON N/A 01/07/2023   Procedure: FISTULA TRANSSPHINCTERIC PLACEMENT OF SETON x2;  Surgeon: Melvenia Stabs, MD;  Location: [facility] OR;  Service: General;  Laterality: N/A;    RECTAL EXAM UNDER ANESTHESIA N/A 08/21/2022   Procedure: ANORECTAL EXAM UNDER ANESTHESIA;  Surgeon: Melvenia Stabs, MD;  Location: [facility];  Service: General;  Laterality: N/A;   RECTAL EXAM UNDER ANESTHESIA N/A 01/07/2023   Procedure: RECTAL EXAM UNDER ANESTHESIA;  Surgeon: Melvenia Stabs, MD;  Location: [facility] OR;  Service: General;  Laterality: N/A;  62    Family History  Problem Relation Age of Onset   Cancer Maternal Grandmother    Early death Maternal Grandfather    Cancer Paternal Grandmother    Early death Paternal Grandfather    Stomach cancer Neg Hx    Esophageal cancer Neg Hx    Colon cancer Neg Hx    Colon polyps Neg Hx    Rectal cancer Neg Hx     Social:  reports that he quit smoking about 15 years ago. His smoking use included cigarettes. He started smoking about 20 years ago. He has never been exposed to tobacco smoke. He has never used smokeless tobacco. He reports current alcohol use of about 14.0 standard drinks of alcohol per week. He reports that he does not use drugs.  Allergies: No Known Allergies  Medications: I have reviewed the patient's current medications.  Results for orders placed or performed during the hospital encounter of 07/13/23 (from the past 48 hours)  Glucose, capillary     Status: Abnormal   Collection Time: 07/13/23  6:11 AM  Result Value Ref Range   Glucose-Capillary 137 (H) 70 - 99 mg/dL    Comment: Glucose reference range applies only to samples taken after fasting for at least 8 hours.   Comment 1 Notify RN     No results found.   PE Blood pressure (!) 134/90, pulse 84, temperature 98.3 F (36.8 C), temperature source Oral, resp. rate 18, height 5\' 10"  (1.778 m), weight 102.5 kg, SpO2 94%. Constitutional: NAD; conversant Eyes: Moist conjunctiva; no lid lag; anicteric Lungs: Normal respiratory effort CV: RRR Psychiatric: Appropriate affect  Results for orders placed or performed during the hospital  encounter of 07/13/23 (from the past 48 hours)  Glucose, capillary     Status: Abnormal   Collection Time: 07/13/23  6:11 AM  Result Value Ref Range   Glucose-Capillary 137 (H) 70 - 99 mg/dL    Comment: Glucose reference range applies only to samples taken after fasting for at least 8 hours.   Comment 1 Notify RN     No results found.  A/P: Travis Ryan is an 48 y.o. 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 Cherryl Corona 03/11/23 -diverticulosis in sigmoid and descending; examined portion of terminal ileum was normal; distal rectum and anal verge are normal on retroflexion; no evidence of Crohn's disease  -The anatomy and physiology of the anal canal was discussed with the patient with associated pictures. The pathophysiology of anal fistula was discussed at length with associated pictures and illustrations as a pertains to his current anatomy -We have reviewed options going forward including further observation vs surgery -endorectal advancement flap versus fistulotomy based on intraoperative findings; anorectal exam under anesthesia -The planned procedure, material risks (including, but not limited to, pain,  bleeding, infection, scarring, need for blood transfusion, damage to anal sphincter, incontinence of gas and/or stool, need for additional procedures, anal stenosis, rare cases of pelvic sepsis which in severe cases may require things like a colostomy, recurrence rates of 20-25%, pneumonia, heart attack, stroke, death) benefits and alternatives to surgery were discussed at length. The patient's questions were answered to his satisfaction, he voiced understanding and elected to proceed with surgery. Additionally, we discussed typical postoperative expectations and the recovery process.   Beatris Lincoln, MD [facility]  [facility]

## 2023-07-13 NOTE — Op Note (Signed)
 07/13/2023  8:42 AM  PATIENT:  Travis Ryan  48 y.o. male  Patient Care Team: Elvira Hammersmith, MD as PCP - General (Internal Medicine) Patient, No Pcp Per (General Practice)  PRE-OPERATIVE DIAGNOSIS: Transsphincteric anal fistula  POST-OPERATIVE DIAGNOSIS: Same  PROCEDURE:  Surgical treatment of transsphincteric anal fistula with creation of endorectal advancement flap Anorectal exam under anesthesia  SURGEON:  Surgeon(s): Melvenia Stabs, MD  ASSISTANT: OR Staff   ANESTHESIA:   local and general  SPECIMEN:   Left posterior external opening of anal fistula Left anterior external opening of anal fistula  DISPOSITION OF SPECIMEN:  PATHOLOGY  COUNTS:  Sponge, needle, and instrument counts were reported correct x2 at conclusion.  EBL: 10 mL  Drains: none  PLAN OF CARE: Discharge to home after PACU  PATIENT DISPOSITION:  PACU - hemodynamically stable.  OR FINDINGS: Indwelling blue vessel loop setons x 2 with a shared internal opening.  2 discrete external openings which have keloid like findings at the cutaneous surface.  Both of these external openings are excised and submitted for pathology.  Internal opening and remainder of anal canal are completely normal in appearance.  Therefore, we opted to proceed with creation of endorectal advancement flap as he was previously had a unsuccessful LIFT procedure.  We felt endorectal advancement flap was the most continence preserving type procedure as opposed to fistulotomy at present.  DESCRIPTION: The patient was identified in the preoperative holding area and taken to the OR. SCDs were applied.  He then underwent general endotracheal anesthesia without difficulty. The patient was then rolled onto the OR table in the prone jackknife position. Pressure points were then evaluated and padded. Benzoin was applied to the buttocks and they were gently taped apart.  He was then prepped and draped in usual sterile fashion.   A surgical timeout was performed indicating the correct patient, procedure, and positioning.  A perianal block was then created using a dilute mixture of 0.25% Marcaine  with epinephrine  and Exparel .  After ascertaining an appropriate level of anesthesia had been achieved, a well lubricated digital rectal exam was performed. This demonstrated no palpable masses or other abnormalities in the anal canal.  Externally, he does have 2 discrete external openings both having been controlled with setons.  There is no significant abscess or other concerning findings.  Both external openings have keloid like changes to them .  A Hill-Ferguson anoscope was into the anal canal and circumferential inspection demonstrated healthy appearing anoderm.  Internal opening is clean in appearance.  There are no fissures or other evident pathologies within the anal canal.  He has previously had a LIFT which was unsuccessful.  The current fistula is reevaluated and found to remain as a low transsphincteric anal fistula.  The internal opening is just distal to the dentate line.  Given these findings, we opted to proceed with an endorectal advancement type flap.  We first excised the indwelling setons after replacing them with a semirigid fistula probe.  The external openings were also excised for pathologic purposes and submitted separately-left anterior and left posterior external openings of the anal fistula.  Hemostasis is then achieved electrocautery.  The anal canal was reinspected.  We then prepared for the endorectal advancement type flap.  The internal opening is circumferentially marked with electrocautery.  The anoderm is then incised.  The tissue just distal and proximal is excised in an elliptical type manner oriented transversely.  The internal opening type tissue was excised.  This was in  a somewhat piecemeal type manner.  We did not divide any sphincter muscle when doing this.  Plan dissection is carried proximally for  approximately 5 to 10 mm.  This was done in an effort to create an advancement type flap.  This is well vascularized in appearance.  The flap is able to be brought to the distal edge without any tension.  This was then closed using a combination of 3-0 and 2-0 Vicryl sutures, interrupted.  The flap was inspected.  All edges are well-approximated.  It is hemostatic.  It is well-perfused in appearance.  The anal canal is irrigated.  Hemostasis is verified.  Additional local anesthetic is infiltrated.  A piece of Surgifoam was placed into the anal canal.  The left anterior external opening of the fistula was somewhat patulous and was therefore partially closed well beyond the evident fistulous tract using two 3-0 Vicryl sutures.  This was to decrease the size of the wound burden.  All sponge, needle, and counts are reported correct.  The buttocks are untaped.  A dressing consisting of 4 x 4's, ABD, and mesh underwear was ultimately placed.  He was rolled back onto a stretcher, awakened from anesthesia, extubated, and transported to recovery in satisfactory condition.  DISPOSITION: PACU in satisfactory condition.

## 2023-07-13 NOTE — Transfer of Care (Signed)
 Immediate Anesthesia Transfer of Care Note  Patient: Travis Ryan  Procedure(s) Performed: SURGICAL TREATMENT ANAL FISTULA EXAM UNDER ANESTHESIA, RECTUM  Patient Location: PACU  Anesthesia Type:General  Level of Consciousness: oriented and drowsy  Airway & Oxygen Therapy: Patient Spontanous Breathing and Patient connected to face mask oxygen  Post-op Assessment: Report given to RN and Post -op Vital signs reviewed and stable  Post vital signs: Reviewed and stable  Last Vitals:  Vitals Value Taken Time  BP 114/52 07/13/23 0903  Temp    Pulse 72 07/13/23 0908  Resp 20 07/13/23 0908  SpO2 99 % 07/13/23 0908  Vitals shown include unfiled device data.  Last Pain:  Vitals:   07/13/23 0621  TempSrc:   PainSc: 4       Patients Stated Pain Goal: 5 (07/13/23 6578)  Complications: No notable events documented.

## 2023-07-13 NOTE — Anesthesia Postprocedure Evaluation (Signed)
 Anesthesia Post Note  Patient: Travis Ryan  Procedure(s) Performed: SURGICAL TREATMENT ANAL FISTULA EXAM UNDER ANESTHESIA, RECTUM     Patient location during evaluation: PACU Anesthesia Type: General Level of consciousness: awake and alert Pain management: pain level controlled Vital Signs Assessment: post-procedure vital signs reviewed and stable Respiratory status: spontaneous breathing, nonlabored ventilation and respiratory function stable Cardiovascular status: blood pressure returned to baseline and stable Postop Assessment: no apparent nausea or vomiting Anesthetic complications: no   No notable events documented.  Last Vitals:  Vitals:   07/13/23 1000 07/13/23 1020  BP: 113/73 121/83  Pulse: 68 82  Resp: 17 16  Temp:  36.6 C  SpO2: 94% 92%    Last Pain:  Vitals:   07/13/23 1100  TempSrc:   PainSc: 0-No pain                 Erin Havers

## 2023-07-13 NOTE — Anesthesia Procedure Notes (Signed)
 Procedure Name: Intubation Date/Time: 07/13/2023 7:31 AM  Performed by: Bing Buff, RNPre-anesthesia Checklist: Patient identified, Emergency Drugs available, Suction available and Patient being monitored Patient Re-evaluated:Patient Re-evaluated prior to induction Oxygen Delivery Method: Circle system utilized Preoxygenation: Pre-oxygenation with 100% oxygen Induction Type: IV induction Ventilation: Mask ventilation without difficulty Tube type: Oral Tube size: 7.5 mm Number of attempts: 1 Airway Equipment and Method: Stylet and Oral airway Placement Confirmation: ETT inserted through vocal cords under direct vision, positive ETCO2 and breath sounds checked- equal and bilateral Secured at: 22 cm Tube secured with: Tape Dental Injury: Teeth and Oropharynx as per pre-operative assessment

## 2023-07-14 ENCOUNTER — Encounter (HOSPITAL_COMMUNITY): Payer: Self-pay | Admitting: Surgery

## 2023-07-14 LAB — SURGICAL PATHOLOGY

## 2023-08-18 ENCOUNTER — Ambulatory Visit: Payer: Self-pay | Admitting: Emergency Medicine

## 2023-08-18 ENCOUNTER — Encounter: Payer: Self-pay | Admitting: Emergency Medicine

## 2023-08-18 ENCOUNTER — Ambulatory Visit: Payer: No Typology Code available for payment source | Admitting: Emergency Medicine

## 2023-08-18 VITALS — BP 126/90 | HR 73 | Temp 98.6°F | Ht 70.0 in | Wt 223.0 lb

## 2023-08-18 DIAGNOSIS — R7303 Prediabetes: Secondary | ICD-10-CM | POA: Insufficient documentation

## 2023-08-18 DIAGNOSIS — Z23 Encounter for immunization: Secondary | ICD-10-CM | POA: Diagnosis not present

## 2023-08-18 DIAGNOSIS — I10 Essential (primary) hypertension: Secondary | ICD-10-CM | POA: Diagnosis not present

## 2023-08-18 DIAGNOSIS — K760 Fatty (change of) liver, not elsewhere classified: Secondary | ICD-10-CM

## 2023-08-18 DIAGNOSIS — Z1159 Encounter for screening for other viral diseases: Secondary | ICD-10-CM

## 2023-08-18 LAB — COMPREHENSIVE METABOLIC PANEL WITH GFR
ALT: 27 U/L (ref 0–53)
AST: 23 U/L (ref 0–37)
Albumin: 4.6 g/dL (ref 3.5–5.2)
Alkaline Phosphatase: 96 U/L (ref 39–117)
BUN: 18 mg/dL (ref 6–23)
CO2: 23 meq/L (ref 19–32)
Calcium: 9.2 mg/dL (ref 8.4–10.5)
Chloride: 107 meq/L (ref 96–112)
Creatinine, Ser: 0.69 mg/dL (ref 0.40–1.50)
GFR: 111.64 mL/min (ref >60.00)
Glucose, Bld: 121 mg/dL — ABNORMAL HIGH (ref 70–99)
Potassium: 4 meq/L (ref 3.5–5.1)
Sodium: 138 meq/L (ref 135–145)
Total Bilirubin: 0.4 mg/dL (ref 0.2–1.2)
Total Protein: 7.6 g/dL (ref 6.0–8.3)

## 2023-08-18 LAB — LIPID PANEL
Cholesterol: 172 mg/dL (ref 0–200)
HDL: 32.3 mg/dL — ABNORMAL LOW (ref >39.00)
LDL Cholesterol: 72 mg/dL (ref 0–99)
NonHDL: 139.96
Total CHOL/HDL Ratio: 5
Triglycerides: 339 mg/dL — ABNORMAL HIGH (ref 0.0–149.0)
VLDL: 67.8 mg/dL — ABNORMAL HIGH (ref 0.0–40.0)

## 2023-08-18 LAB — CBC WITH DIFFERENTIAL/PLATELET
Basophils Absolute: 0 10*3/uL (ref 0.0–0.1)
Basophils Relative: 0.5 % (ref 0.0–3.0)
Eosinophils Absolute: 0.6 10*3/uL (ref 0.0–0.7)
Eosinophils Relative: 6.8 % — ABNORMAL HIGH (ref 0.0–5.0)
HCT: 42.9 % (ref 39.0–52.0)
Hemoglobin: 14.7 g/dL (ref 13.0–17.0)
Lymphocytes Relative: 33.2 % (ref 12.0–46.0)
Lymphs Abs: 2.8 10*3/uL (ref 0.7–4.0)
MCHC: 34.3 g/dL (ref 30.0–36.0)
MCV: 84.5 fl (ref 78.0–100.0)
Monocytes Absolute: 0.6 10*3/uL (ref 0.1–1.0)
Monocytes Relative: 6.4 % (ref 3.0–12.0)
Neutro Abs: 4.5 10*3/uL (ref 1.4–7.7)
Neutrophils Relative %: 53.1 % (ref 43.0–77.0)
Platelets: 185 10*3/uL (ref 150.0–400.0)
RBC: 5.08 Mil/uL (ref 4.22–5.81)
RDW: 12.7 % (ref 11.5–15.5)
WBC: 8.6 10*3/uL (ref 4.0–10.5)

## 2023-08-18 LAB — POCT GLYCOSYLATED HEMOGLOBIN (HGB A1C): Hemoglobin A1C: 6.3 % — AB (ref 4.0–5.6)

## 2023-08-18 NOTE — Assessment & Plan Note (Signed)
 BP Readings from Last 3 Encounters:  08/18/23 (!) 126/90  07/13/23 121/83  07/08/23 (!) 136/104  Well-controlled hypertension off lisinopril  Cardiovascular risks associated with hypertension discussed Vies to continue monitoring blood pressure readings at home for the next several weeks and keep a log.  Advised to contact the office if numbers persistently abnormal Had side effects related to the lisinopril  Follow-up in 3 months Blood work done today

## 2023-08-18 NOTE — Assessment & Plan Note (Signed)
 Cardiovascular risks associated with diabetes discussed Diet and nutrition discussed Hemoglobin A1c 6.3 today Blood work done today Follow-up in 3 months

## 2023-08-18 NOTE — Patient Instructions (Signed)
 Mantenimiento de Research officer, political party, Male Adoptar un estilo de vida saludable y recibir atencin preventiva son importantes para promover la salud y Counsellor. Consulte al mdico sobre: El esquema adecuado para hacerse pruebas y exmenes peridicos. Cosas que puede hacer por su cuenta para prevenir enfermedades y [place] sano. Qu debo saber sobre la dieta, el peso y el ejercicio? Consuma una dieta saludable  Consuma una dieta que incluya muchas verduras, frutas, productos lcteos con bajo contenido de [place] y Associate Professor. No consuma muchos alimentos ricos en grasas slidas, azcares agregados o sodio. Mantenga un peso saludable El ndice de masa muscular [facility]) es una medida que puede utilizarse para identificar posibles problemas de [place]. Proporciona una estimacin de la grasa corporal basndose en el peso y la altura. Su mdico puede ayudarle a Engineer, site IMC y a Personnel officer o Pharmacologist un peso saludable. Haga ejercicio con regularidad Haga ejercicio con regularidad. Esta es una de las prcticas ms importantes que puede hacer por su salud. La [employer] de los adultos deben seguir estas pautas: Education officer, environmental, al menos, 150 minutos de actividad fsica por semana. El ejercicio debe aumentar la frecuencia cardaca y Media planner transpirar (ejercicio de intensidad moderada). Hacer ejercicios de fortalecimiento por lo [facility] por semana. Agregue esto a su plan de ejercicio de intensidad moderada. Pase menos tiempo sentado. Incluso la actividad fsica ligera puede ser beneficiosa. Controle sus niveles de colesterol y lpidos en la sangre Comience a realizarse anlisis de lpidos y Oncologist en la sangre a los 20 aos y luego reptalos cada 5 aos. Es posible que Insurance underwriter los niveles de colesterol con mayor frecuencia si: Sus niveles de lpidos y colesterol son altos. Es mayor de 40 aos. Presenta un alto riesgo de padecer enfermedades cardacas. Qu debo  saber sobre las pruebas de deteccin del cncer? Muchos tipos de cncer pueden detectarse de manera temprana y, a menudo, pueden prevenirse. Segn su historia clnica y sus antecedentes familiares, es posible que deba realizarse pruebas de deteccin del cncer en diferentes edades. Esto puede incluir pruebas de deteccin de lo siguiente: Building services engineer. Cncer de prstata. Cncer de piel. Cncer de pulmn. Qu debo saber sobre la enfermedad cardaca, la diabetes y la hipertensin arterial? Presin arterial y enfermedad cardaca La hipertensin arterial causa enfermedades cardacas y [place] el riesgo de accidente cerebrovascular. Es ms probable que esto se manifieste en las personas que tienen lecturas de presin arterial alta o tienen sobrepeso. Hable con el mdico sobre sus valores de presin arterial deseados. Hgase controlar la presin arterial: Cada 3 a 5 aos si tiene entre 18 y 71 aos. Todos los aos si es mayor de 40 aos. Si tiene entre 65 y 26 aos y es fumador o Insurance underwriter, pregntele al mdico si debe realizarse una prueba de deteccin de aneurisma artico abdominal (AAA) por nica vez. Diabetes Realcese exmenes de deteccin de la diabetes con regularidad. Este anlisis revisa el nivel de azcar en la sangre en [place]. Hgase las pruebas de deteccin: Cada tres aos despus de los 45 aos de edad si tiene un peso normal y un bajo riesgo de padecer diabetes. Con ms frecuencia y a partir de [place] edad inferior si tiene sobrepeso o un alto riesgo de padecer diabetes. Qu debo saber sobre la prevencin de infecciones? Hepatitis B Si tiene un riesgo ms alto de contraer hepatitis B, debe someterse a un examen de deteccin de este virus. Hable con el mdico para averiguar si tiene riesgo de  contraer la infeccin por hepatitis B. Hepatitis C Se recomienda un anlisis de [place] para: Todos los que nacieron entre 1945 y [phone]. Todas las personas que tengan un riesgo de haber  contrado hepatitis C. Enfermedades de transmisin sexual (ETS) Debe realizarse pruebas de deteccin de ITS todos los aos, incluidas la gonorrea y la clamidia, si: Es sexualmente activo y es menor de [address]. Es mayor de [address], y Public affairs consultant informa que corre riesgo de tener este tipo de infecciones. La actividad sexual ha cambiado desde que le hicieron la ltima prueba de deteccin y tiene un riesgo mayor de Warehouse manager clamidia o Copy. Pregntele al mdico si usted tiene riesgo. Pregntele al mdico si usted tiene un alto riesgo de Primary school teacher VIH. El mdico tambin puede recomendarle un medicamento recetado para ayudar a evitar la infeccin por el VIH. Si elige tomar medicamentos para prevenir el VIH, primero debe [employer] de deteccin del VIH. Luego debe hacerse anlisis cada 3 meses mientras est tomando los medicamentos. Siga estas indicaciones en su casa: Consumo de alcohol No beba alcohol si el mdico se lo prohbe. Si bebe alcohol: Limite la cantidad que consume de 0 a 2 bebidas por da. Sepa cunta cantidad de alcohol hay en las bebidas que toma. En los [address], una medida equivale a una botella de cerveza de 12 oz (355 ml), un vaso de vino de 5 oz (148 ml) o un vaso de una bebida alcohlica de alta graduacin de 1 oz (44 ml). Estilo de vida No consuma ningn producto que contenga nicotina o tabaco. Estos productos incluyen cigarrillos, tabaco para Theatre manager y aparatos de vapeo, como los Administrator, Civil Service. Si necesita ayuda para dejar de consumir estos productos, consulte al mdico. No consuma drogas. No comparta agujas. Solicite ayuda a su mdico si necesita apoyo o informacin para abandonar las drogas. Indicaciones generales Realcese los estudios de rutina de [address], dentales y de Wellsite geologist. Mantngase al da con las vacunas. Infrmele a su mdico si: Se siente deprimido con frecuencia. Alguna vez ha sido vctima de [place] o no se siente seguro en su  casa. Resumen Adoptar un estilo de vida saludable y recibir atencin preventiva son importantes para promover la salud y Counsellor. Siga las instrucciones del mdico acerca de una dieta saludable, el ejercicio y la realizacin de pruebas o exmenes para Hotel manager. Siga las instrucciones del mdico con respecto al control del colesterol y la presin arterial. Esta informacin no tiene Theme park manager el consejo del mdico. Asegrese de hacerle al mdico cualquier pregunta que tenga. Document Revised: 08/15/2020 Document Reviewed: 08/15/2020 Elsevier Patient Education  2024 [employer].

## 2023-08-18 NOTE — Progress Notes (Signed)
 Travis Ryan 48 y.o.   Chief Complaint  Patient presents with   Follow-up    3 month f/u HTN. Patient states he's in some pain on a scale of 1-10 he's at a 4. While in the hospital for his surgery they said his sugars was high. Patient says he's been monitor his bp at home and its been good. He mentions his stopped lisinopril  for about 2 weeks makes him feel weird.     HISTORY OF PRESENT ILLNESS: This is a 48 y.o. male here for 5-month follow-up of hypertension Since her last visit he was able to have surgery done for anal fissure.  Recovering well. Told his sugar was elevated and needed follow-up with PCP Overall doing well.  Has no other complaints or medical concerns today.  Lab Results  Component Value Date   HGBA1C 7.0 (H) 05/18/2023   BP Readings from Last 3 Encounters:  08/18/23 (!) 126/90  07/13/23 121/83  07/08/23 (!) 136/104     HPI   Prior to Admission medications   Medication Sig Start Date End Date Taking? Authorizing Provider  lisinopril  (ZESTRIL ) 20 MG tablet Take 1 tablet (20 mg total) by mouth daily. 05/18/23  Yes Jaquel Coomer, Isidro Margo, MD  acetaminophen  (TYLENOL ) 650 MG CR tablet Take 650-1,300 mg by mouth every 8 (eight) hours as needed for pain. Patient not taking: Reported on 08/18/2023    [provider]  fluticasone (FLONASE) 50 MCG/ACT nasal spray Place 2 sprays into both nostrils daily. Patient not taking: Reported on 08/18/2023    [provider]  ibuprofen (ADVIL) 200 MG tablet Take 600-1,200 mg by mouth every 8 (eight) hours as needed for moderate pain (pain score 4-6). Patient not taking: Reported on 08/18/2023    [provider]    No Known Allergies  Patient Active Problem List   Diagnosis Date Noted   Essential hypertension 05/18/2023    Past Medical History:  Diagnosis Date   Anal fistula    Asthma    Seasonal   Diabetes mellitus without complication (HCC)    no meds hgbA1c 7.0 05-18-23    Hypertension    no meds think it is pain related   Perirectal abscess    Seasonal allergies     Past Surgical History:  Procedure Laterality Date   FISTULOTOMY N/A 05/15/2022   Procedure: CONTROL OF ANAL FISTULA/INTERROGATION WITH Partial FISTULOTOMY and SETON, POSSIBLE INCISION/DRAINAGE OF PERIRECTAL ABSCESS;  Surgeon: Melvenia Stabs, MD;  Location: [facility];  Service: General;  Laterality: N/A;   INGUINAL HERNIA REPAIR Left    2018   LIGATION OF INTERNAL FISTULA TRACT N/A 08/21/2022   Procedure: LIGATION OF INTERSPHINCTERIC FISTULA TRACT;  Surgeon: Melvenia Stabs, MD;  Location: [facility];  Service: General;  Laterality: N/A;   MUCOSAL ADVANCEMENT FLAP N/A 07/13/2023   Procedure: SURGICAL TREATMENT ANAL FISTULA;  Surgeon: Melvenia Stabs, MD;  Location: [facility];  Service: General;  Laterality: N/A;   PLACEMENT OF SETON N/A 01/07/2023   Procedure: FISTULA TRANSSPHINCTERIC PLACEMENT OF SETON x2;  Surgeon: Melvenia Stabs, MD;  Location: [facility];  Service: General;  Laterality: N/A;   RECTAL EXAM UNDER ANESTHESIA N/A 08/21/2022   Procedure: ANORECTAL EXAM UNDER ANESTHESIA;  Surgeon: Melvenia Stabs, MD;  Location: [facility];  Service: General;  Laterality: N/A;   RECTAL EXAM UNDER ANESTHESIA N/A 01/07/2023   Procedure: RECTAL EXAM UNDER ANESTHESIA;  Surgeon: Melvenia Stabs, MD;  Location: [facility];  Service: General;  Laterality: N/A;  60   RECTAL EXAM UNDER ANESTHESIA N/A 07/13/2023   Procedure: EXAM UNDER ANESTHESIA, RECTUM;  Surgeon: Melvenia Stabs, MD;  Location: [facility];  Service: General;  Laterality: N/A;    Social History   Socioeconomic History   Marital status: Single    Spouse name: Not on file   Number of children: 0   Years of education: Not on file   Highest education level: 12th grade  Occupational History   Occupation: floor install  Tobacco Use   Smoking status: Former    Current  packs/day: 0.00    Types: Cigarettes    Start date: 2005    Quit date: 2010    Years since quitting: 15.4    Passive exposure: Never   Smokeless tobacco: Never  Vaping Use   Vaping status: Never Used  Substance and Sexual Activity   Alcohol use: Yes    Alcohol/week: 14.0 standard drinks of alcohol    Types: 14 Cans of beer per week    Comment: 1 to 2 beers per day   Drug use: Never   Sexual activity: Yes    Birth control/protection: None  Other Topics Concern   Not on file  Social History Narrative   ** Merged History Encounter **       Social Drivers of Health   Financial Resource Strain: Low Risk  (05/14/2023)   Overall Financial Resource Strain (CARDIA)    Difficulty of Paying Living Expenses: Not hard at all  Food Insecurity: No Food Insecurity (05/14/2023)   Hunger Vital Sign    Worried About Running Out of Food in the Last Year: Never true    Ran Out of Food in the Last Year: Never true  Transportation Needs: Unmet Transportation Needs (05/14/2023)   PRAPARE - Transportation    Lack of Transportation (Medical): No    Lack of Transportation (Non-Medical): Yes  Physical Activity: Sufficiently Active (05/14/2023)   Exercise Vital Sign    Days of Exercise per Week: 5 days    Minutes of Exercise per Session: 60 min  Stress: Stress Concern Present (05/14/2023)   [employer] of Occupational Health - Occupational Stress Questionnaire    Feeling of Stress : Very much  Social Connections: Moderately Integrated (05/14/2023)   Social Connection and Isolation Panel [NHANES]    Frequency of Communication with Friends and Family: More than three times a week    Frequency of Social Gatherings with Friends and Family: More than three times a week    Attends Religious Services: 1 to 4 times per year    Active Member of [employer] or Organizations: No    Attends Engineer, structural: Not on file    Marital Status: Living with partner  Intimate Partner Violence: Not on file     Family History  Problem Relation Age of Onset   Cancer Maternal Grandmother    Early death Maternal Grandfather    Cancer Paternal Grandmother    Early death Paternal Grandfather    Stomach cancer Neg Hx    Esophageal cancer Neg Hx    Colon cancer Neg Hx    Colon polyps Neg Hx    Rectal cancer Neg Hx      Review of Systems  Constitutional: Negative.  Negative for chills and fever.  HENT: Negative.  Negative for congestion and sore throat.   Respiratory: Negative.  Negative for cough and shortness of breath.   Cardiovascular: Negative.  Negative  for chest pain and palpitations.  Gastrointestinal:  Negative for abdominal pain, diarrhea, nausea and vomiting.  Genitourinary: Negative.  Negative for dysuria and hematuria.  Skin: Negative.  Negative for rash.  Neurological: Negative.  Negative for dizziness and headaches.  All other systems reviewed and are negative.   Vitals:   08/18/23 0903  BP: (!) 126/90  Pulse: 73  Temp: 98.6 F (37 C)  SpO2: 93%    Physical Exam Vitals reviewed.  Constitutional:      Appearance: Normal appearance.  HENT:     Head: Normocephalic.     Mouth/Throat:     Mouth: Mucous membranes are moist.     Pharynx: Oropharynx is clear.  Eyes:     Extraocular Movements: Extraocular movements intact.     Pupils: Pupils are equal, round, and reactive to light.  Cardiovascular:     Rate and Rhythm: Normal rate and regular rhythm.     Pulses: Normal pulses.     Heart sounds: Normal heart sounds.  Pulmonary:     Effort: Pulmonary effort is normal.     Breath sounds: Normal breath sounds.  Abdominal:     Palpations: Abdomen is soft.     Tenderness: There is no abdominal tenderness.  Musculoskeletal:     Cervical back: No tenderness.  Lymphadenopathy:     Cervical: No cervical adenopathy.  Skin:    General: Skin is warm and dry.     Capillary Refill: Capillary refill takes less than 2 seconds.  Neurological:     Mental Status: He is  alert.  Psychiatric:        Mood and Affect: Mood normal.        Behavior: Behavior normal.    Results for orders placed or performed in visit on 08/18/23 (from the past 24 hours)  POCT HgB A1C     Status: Abnormal   Collection Time: 08/18/23 12:55 PM  Result Value Ref Range   Hemoglobin A1C 6.3 (A) 4.0 - 5.6 %   HbA1c POC (<> result, manual entry)     HbA1c, POC (prediabetic range)     HbA1c, POC (controlled diabetic range)       ASSESSMENT & PLAN: A total of 43 minutes was spent with the patient and counseling/coordination of care regarding preparing for this visit, review of most recent office visit notes, review of multiple chronic medical conditions and their management, cardiovascular risks associated with hypertension and diabetes, review of all medications, review of most recent bloodwork results including interpretation of today's hemoglobin A1c, review of health maintenance items, education on nutrition, prognosis, documentation, and need for follow up.   Problem List Items Addressed This Visit       Cardiovascular and Mediastinum   Essential hypertension - Primary   BP Readings from Last 3 Encounters:  08/18/23 (!) 126/90  07/13/23 121/83  07/08/23 (!) 136/104  Well-controlled hypertension off lisinopril  Cardiovascular risks associated with hypertension discussed Vies to continue monitoring blood pressure readings at home for the next several weeks and keep a log.  Advised to contact the office if numbers persistently abnormal Had side effects related to the lisinopril  Follow-up in 3 months Blood work done today       Relevant Orders   CBC with Differential/Platelet   Comprehensive metabolic panel with GFR   Lipid panel     Digestive   Hepatic steatosis   Relevant Orders   Hepatitis C antibody     Other   Prediabetes   Cardiovascular risks  associated with diabetes discussed Diet and nutrition discussed Hemoglobin A1c 6.3 today Blood work done  today Follow-up in 3 months      Relevant Orders   POCT HgB A1C   Other Visit Diagnoses       Need for vaccination       Relevant Orders   Tdap vaccine greater than or equal to 7yo IM     Need for hepatitis C screening test       Relevant Orders   Hepatitis C antibody      Patient Instructions  Mantenimiento de Radiographer, therapeutic en los hombres Health Maintenance, Male Adoptar un estilo de vida saludable y recibir atencin preventiva son importantes para promover la salud y Counsellor. Consulte al mdico sobre: El esquema adecuado para hacerse pruebas y exmenes peridicos. Cosas que puede hacer por su cuenta para prevenir enfermedades y [place] sano. Qu debo saber sobre la dieta, el peso y el ejercicio? Consuma una dieta saludable  Consuma una dieta que incluya muchas verduras, frutas, productos lcteos con bajo contenido de grasa y protenas magras. No consuma muchos alimentos ricos en grasas slidas, azcares agregados o sodio. Mantenga un peso saludable El ndice de masa muscular [facility]) es una medida que puede utilizarse para identificar posibles problemas de [place]. Proporciona una estimacin de la grasa corporal basndose en el peso y la altura. Su mdico puede ayudarle a determinar su IMC y a Personnel officer o Pharmacologist un peso saludable. Haga ejercicio con regularidad Haga ejercicio con regularidad. Esta es una de las prcticas ms importantes que puede hacer por su salud. La [employer] de los adultos deben seguir estas pautas: Education officer, environmental, al menos, 150 minutos de actividad fsica por semana. El ejercicio debe aumentar la frecuencia cardaca y Media planner transpirar (ejercicio de intensidad moderada). Hacer ejercicios de fortalecimiento por lo [facility] por semana. Agregue esto a su plan de ejercicio de intensidad moderada. Pase menos tiempo sentado. Incluso la actividad fsica ligera puede ser beneficiosa. Controle sus niveles de colesterol y lpidos en la sangre Comience a realizarse  anlisis de lpidos y Oncologist en la sangre a los 20 aos y luego reptalos cada 5 aos. Es posible que Insurance underwriter los niveles de colesterol con mayor frecuencia si: Sus niveles de lpidos y colesterol son altos. Es mayor de 40 aos. Presenta un alto riesgo de padecer enfermedades cardacas. Qu debo saber sobre las pruebas de deteccin del cncer? Muchos tipos de cncer pueden detectarse de manera temprana y, a menudo, pueden prevenirse. Segn su historia clnica y sus antecedentes familiares, es posible que deba realizarse pruebas de deteccin del cncer en diferentes edades. Esto puede incluir pruebas de deteccin de lo siguiente: Building services engineer. Cncer de prstata. Cncer de piel. Cncer de pulmn. Qu debo saber sobre la enfermedad cardaca, la diabetes y la hipertensin arterial? Presin arterial y enfermedad cardaca La hipertensin arterial causa enfermedades cardacas y [place] el riesgo de accidente cerebrovascular. Es ms probable que esto se manifieste en las personas que tienen lecturas de presin arterial alta o tienen sobrepeso. Hable con el mdico sobre sus valores de presin arterial deseados. Hgase controlar la presin arterial: Cada 3 a 5 aos si tiene entre 18 y 20 aos. Todos los aos si es mayor de 40 aos. Si tiene entre 65 y 72 aos y es fumador o Insurance underwriter, pregntele al mdico si debe realizarse una prueba de deteccin de aneurisma artico abdominal (AAA) por nica vez. Diabetes Realcese exmenes de deteccin de la diabetes con regularidad. Este [employer]  revisa el nivel de azcar en la sangre en ayunas. Hgase las pruebas de deteccin: Cada tres aos despus de los 45 aos de edad si tiene un peso normal y un bajo riesgo de padecer diabetes. Con ms frecuencia y a partir de [place] edad inferior si tiene sobrepeso o un alto riesgo de padecer diabetes. Qu debo saber sobre la prevencin de infecciones? Hepatitis B Si tiene un riesgo ms alto de  contraer hepatitis B, debe someterse a un examen de deteccin de este virus. Hable con el mdico para averiguar si tiene riesgo de contraer la infeccin por hepatitis B. Hepatitis C Se recomienda un anlisis de [place] para: Todos los que nacieron entre 1945 y [phone]. Todas las personas que tengan un riesgo de haber contrado hepatitis C. Enfermedades de transmisin sexual (ETS) Debe realizarse pruebas de deteccin de ITS todos los aos, incluidas la gonorrea y la clamidia, si: Es sexualmente activo y es menor de [address]. Es mayor de [address], y Public affairs consultant informa que corre riesgo de tener este tipo de infecciones. La actividad sexual ha cambiado desde que le hicieron la ltima prueba de deteccin y tiene un riesgo mayor de tener clamidia o Copy. Pregntele al mdico si usted tiene riesgo. Pregntele al mdico si usted tiene un alto riesgo de Primary school teacher VIH. El mdico tambin puede recomendarle un medicamento recetado para ayudar a evitar la infeccin por el VIH. Si elige tomar medicamentos para prevenir el VIH, primero debe [employer] de deteccin del VIH. Luego debe hacerse anlisis cada 3 meses mientras est tomando los medicamentos. Siga estas indicaciones en su casa: Consumo de alcohol No beba alcohol si el mdico se lo prohbe. Si bebe alcohol: Limite la cantidad que consume de 0 a 2 bebidas por da. Sepa cunta cantidad de alcohol hay en las bebidas que toma. En los [address], una medida equivale a una botella de cerveza de 12 oz (355 ml), un vaso de vino de 5 oz (148 ml) o un vaso de una bebida alcohlica de alta graduacin de 1 oz (44 ml). Estilo de vida No consuma ningn producto que contenga nicotina o tabaco. Estos productos incluyen cigarrillos, tabaco para Theatre manager y aparatos de vapeo, como los cigarrillos electrnicos. Si necesita ayuda para dejar de consumir estos productos, consulte al mdico. No consuma drogas. No comparta agujas. Solicite ayuda a su mdico si  necesita apoyo o informacin para abandonar las drogas. Indicaciones generales Realcese los estudios de rutina de [address], dentales y de Wellsite geologist. Mantngase al da con las vacunas. Infrmele a su mdico si: Se siente deprimido con frecuencia. Alguna vez ha sido vctima de maltrato o no se siente seguro en su casa. Resumen Adoptar un estilo de vida saludable y recibir atencin preventiva son importantes para promover la salud y Counsellor. Siga las instrucciones del mdico acerca de una dieta saludable, el ejercicio y la realizacin de pruebas o exmenes para Hotel manager. Siga las instrucciones del mdico con respecto al control del colesterol y la presin arterial. Esta informacin no tiene Theme park manager el consejo del mdico. Asegrese de hacerle al mdico cualquier pregunta que tenga. Document Revised: 08/15/2020 Document Reviewed: 08/15/2020 Elsevier Patient Education  2024 Elsevier Inc.     Maryagnes Small, MD [facility]

## 2023-08-19 LAB — HEPATITIS C ANTIBODY: Hepatitis C Ab: NONREACTIVE

## 2023-10-21 ENCOUNTER — Other Ambulatory Visit: Payer: Self-pay

## 2023-10-21 ENCOUNTER — Emergency Department (HOSPITAL_COMMUNITY)
Admission: EM | Admit: 2023-10-21 | Discharge: 2023-10-21 | Disposition: A | Discharge status: Home / Self Care | Source: Ambulatory Visit | Attending: Emergency Medicine | Admitting: Emergency Medicine

## 2023-10-21 ENCOUNTER — Emergency Department (HOSPITAL_COMMUNITY)

## 2023-10-21 DIAGNOSIS — I10 Essential (primary) hypertension: Secondary | ICD-10-CM | POA: Diagnosis not present

## 2023-10-21 DIAGNOSIS — K603 Anal fistula, unspecified: Secondary | ICD-10-CM | POA: Diagnosis not present

## 2023-10-21 DIAGNOSIS — Z79899 Other long term (current) drug therapy: Secondary | ICD-10-CM | POA: Diagnosis not present

## 2023-10-21 DIAGNOSIS — R7309 Other abnormal glucose: Secondary | ICD-10-CM | POA: Insufficient documentation

## 2023-10-21 DIAGNOSIS — R109 Unspecified abdominal pain: Secondary | ICD-10-CM | POA: Diagnosis present

## 2023-10-21 LAB — BASIC METABOLIC PANEL WITH GFR
Anion gap: 5 (ref 5–15)
BUN: 12 mg/dL (ref 6–20)
CO2: 22 mmol/L (ref 22–32)
Calcium: 8.7 mg/dL — ABNORMAL LOW (ref 8.9–10.3)
Chloride: 109 mmol/L (ref 98–111)
Creatinine, Ser: 0.65 mg/dL (ref 0.61–1.24)
GFR, Estimated: >60 mL/min (ref >60)
Glucose, Bld: 133 mg/dL — ABNORMAL HIGH (ref 70–99)
Potassium: 3.9 mmol/L (ref 3.5–5.1)
Sodium: 136 mmol/L (ref 135–145)

## 2023-10-21 LAB — CBC
HCT: 42.2 % (ref 39.0–52.0)
Hemoglobin: 13.8 g/dL (ref 13.0–17.0)
MCH: 28.7 pg (ref 26.0–34.0)
MCHC: 32.7 g/dL (ref 30.0–36.0)
MCV: 87.7 fL (ref 80.0–100.0)
Platelets: 200 K/uL (ref 150–400)
RBC: 4.81 MIL/uL (ref 4.22–5.81)
RDW: 12.8 % (ref 11.5–15.5)
WBC: 5.9 K/uL (ref 4.0–10.5)
nRBC: 0 % (ref 0.0–0.2)

## 2023-10-21 MED ORDER — IOHEXOL 300 MG/ML  SOLN
100.0000 mL | Freq: Once | INTRAMUSCULAR | Status: AC | PRN
  Administered 2023-10-21: 100 mL via INTRAVENOUS

## 2023-10-21 NOTE — ED Triage Notes (Signed)
 Patient to ED by POV with c/o pain related to anal fistula. He had procedure 5/20 went to see provider yesterday and was instructed to follow up in ED for CT.

## 2023-10-21 NOTE — Discharge Instructions (Addendum)
 Take the antibiotics as prescribed by the general surgery office yesterday.  They would like to see you in the office in 1 week to make sure you are improving.  The office should contact you regarding that appointment.

## 2023-10-21 NOTE — ED Provider Notes (Signed)
 [place] EMERGENCY DEPARTMENT AT [facility] Provider Note   CSN: [id number] Arrival date & time: 10/21/23  9185     Patient presents with: Abdominal Pain and Anal Fistula   Travis Ryan is a 48 y.o. male.    Abdominal Pain    Patient has history of hypertension prediabetes hepatic steatosis.  Patient states he had a procedure for a perianal fistula on May 20.  Patient states recently he started to notice some increasing pain and discomfort.  Patient states it was starting to feel like it did prior to the surgery.  Patient was seen at the surgeons office yesterday.  Patient states he was evaluated by a provider there.  They wanted him to get a CT scan.  Patient has discharge summary notes from that visit where patient was instructed to go to the ER yesterday to have a CT scan.  Patient states he was not able to go yesterday so he came this morning.  He denies any fevers or chills.  No vomiting or diarrhea  Prior to Admission medications   Medication Sig Start Date End Date Taking? Authorizing Provider  acetaminophen  (TYLENOL ) 650 MG CR tablet Take 650-1,300 mg by mouth every 8 (eight) hours as needed for pain. Patient not taking: Reported on 08/18/2023    [provider]  fluticasone (FLONASE) 50 MCG/ACT nasal spray Place 2 sprays into both nostrils daily. Patient not taking: Reported on 08/18/2023    [provider]  ibuprofen (ADVIL) 200 MG tablet Take 600-1,200 mg by mouth every 8 (eight) hours as needed for moderate pain (pain score 4-6). Patient not taking: Reported on 08/18/2023    [provider]  lisinopril  (ZESTRIL ) 20 MG tablet Take 1 tablet (20 mg total) by mouth daily. 05/18/23   Sagardia, Miguel Jose, MD    Allergies: Patient has no known allergies.    Review of Systems  Gastrointestinal:  Positive for abdominal pain.    Updated Vital Signs BP 115/74 (BP Location: Left Arm)   Pulse 75   Temp (!) 97.4 F (36.3 C) (Oral)    Resp 16   Ht 1.778 m (5' 10)   Wt 103 kg   SpO2 99%   BMI 32.57 kg/m   Physical Exam Vitals and nursing note reviewed.  Constitutional:      General: He is not in acute distress.    Appearance: He is well-developed.  HENT:     Head: Normocephalic and atraumatic.     Right Ear: External ear normal.     Left Ear: External ear normal.  Eyes:     General: No scleral icterus.       Right eye: No discharge.        Left eye: No discharge.     Conjunctiva/sclera: Conjunctivae normal.  Neck:     Trachea: No tracheal deviation.  Cardiovascular:     Rate and Rhythm: Normal rate.  Pulmonary:     Effort: Pulmonary effort is normal. No respiratory distress.     Breath sounds: No stridor.  Abdominal:     General: There is no distension.     Tenderness: There is no abdominal tenderness. There is no guarding or rebound.  Genitourinary:    Comments: Perianal area with notable scar tissue, no obvious drainage at this time, tenderness to palpation Musculoskeletal:        General: No swelling or deformity.     Cervical back: Neck supple.  Skin:    General: Skin is  warm and dry.     Findings: No rash.  Neurological:     Mental Status: He is alert. Mental status is at baseline.     Cranial Nerves: No dysarthria or facial asymmetry.     Motor: No seizure activity.     (all labs ordered are listed, but only abnormal results are displayed) Labs Reviewed  BASIC METABOLIC PANEL WITH GFR - Abnormal; Notable for the following components:      Result Value   Glucose, Bld 133 (*)    Calcium 8.7 (*)    All other components within normal limits  CBC    EKG: None  Radiology: CT PELVIS W CONTRAST Result Date: 10/21/2023 CLINICAL DATA:  perianal fistual history, recent surgery, increasing pain. EXAM: CT PELVIS WITH CONTRAST TECHNIQUE: Multidetector CT imaging of the pelvis was performed using the standard protocol following the bolus administration of intravenous contrast. RADIATION DOSE  REDUCTION: This exam was performed according to the departmental dose-optimization program which includes automated exposure control, adjustment of the mA and/or kV according to patient size and/or use of iterative reconstruction technique. CONTRAST:  OMNIPAQUE  IOHEXOL  300 MG/ML  SOLN COMPARISON:  CT scan pelvis from 04/14/2018. FINDINGS: Urinary Tract: No abnormality visualized. Unremarkable urinary bladder. Bowel: No disproportionate dilation of the visualized small or large bowel loops. No evidence of abnormal bowel wall thickening or inflammatory changes. The appendix is unremarkable. There are multiple diverticula mainly in the sigmoid colon, without imaging signs of diverticulitis. Vascular/Lymphatic: No ascites or pneumoperitoneum. No pelvic lymphadenopathy, by size criteria. No aneurysmal dilation of the major arteries. Reproductive: Normal size prostate. Symmetric seminal vesicles. Other: Redemonstration of asymmetric fullness in the left perianal region. There is a linear hyperattenuating perianal fistulous tract originating at 3 o'clock position at the anus level and extending inferiorly as well as posteriorly in the subcutaneous tissue to open along the left paramedian gluteal cleft. There is not significant fat stranding surrounding the fistulous tract. No discrete associated drainable abscess or collection seen within the limitations of a CT scan. The soft tissues and abdominal wall are otherwise unremarkable. Musculoskeletal: No suspicious osseous lesions. There are mild multilevel degenerative changes in the visualized spine. IMPRESSION: 1. There is a left perianal fistulous tract, as described above. No discrete associated drainable abscess or collection seen within the limitations of a CT scan. 2. Multiple other nonacute observations, as described above. Electronically Signed   By: Ree Molt M.D.   On: 10/21/2023 10:47     Procedures   Medications Ordered in the ED  iohexol   (OMNIPAQUE ) 300 MG/ML solution 100 mL (100 mLs Intravenous Contrast Given 10/21/23 1014)    Clinical Course as of 10/21/23 1235  Wed Oct 21, 2023  1108 CBC CBC normal.  Metabolic panel with increased glucose slightly decreased calcium level doubt acutely significant [JK]  1109 CT scan shows left perianal fistulous tract.  No drainable abscess.  Will review findings with general surgery on-call.  I spoke with the OR nurse who will pass the information to the surgeon on call [JK]    Clinical Course User Index [JK] Randol Simmonds, MD                                 Medical Decision Making Problems Addressed: Perianal fistula: acute illness or injury that poses a threat to life or bodily functions  Amount and/or Complexity of Data Reviewed Labs: ordered. Decision-making details documented in ED  Course. Radiology: ordered and independent interpretation performed. Discussion of management or test interpretation with external provider(s): Case discussed with Dr. Signe general surgery  Risk Prescription drug management.   Patient presented to the ED for evaluation of persistent rectal pain.  Patient has history of perianal fistula.  He was seen at the surgeon's office yesterday and they wanted him to get a stat CT scan.  Patient denies any fevers or chills.  He is not having any abdominal pain.  There is no obvious purulent drainage.  CT scan does not show any signs of definite abscess.  I reviewed the case with Dr. Cameron.  She reviewed the films and agrees with the plan yesterday with oral antibiotics.  Patient will follow-up in the surgeons office in a week to be rechecked.     Final diagnoses:  Perianal fistula    ED Discharge Orders     None          Randol Simmonds, MD 10/21/23 1236

## 2023-10-21 NOTE — ED Notes (Signed)
 Pt alert, NAD, calm, interactive. Endorses rectal pain and a little bleeding r/t anal fistula. Denies fever, weakness, fatigue, sob, NVD, constipation, dizziness or other sx.

## 2023-10-30 ENCOUNTER — Telehealth: Payer: Self-pay

## 2023-10-30 NOTE — Telephone Encounter (Signed)
 Not sure why he wants to do this.  He needs to discuss this with the surgical group he is currently seeing and voice his displeasure or concerns to them.  This is a very good surgical group.  Do not recommend switching at this time.

## 2023-10-30 NOTE — Telephone Encounter (Signed)
 Copied from CRM [id number]. Topic: General - Other >> Oct 30, 2023 11:15 AM Travis Ryan DEL wrote: Reason for CRM: Patient is requesting a phone call from provider to discuss and get his advice on switching doctor from surgeon Lonni Evonne fry has since then got another infection.He would like advice from Dr Purcell. stated that he may need to see a different doctor since he is not getting better from his surgery that he had

## 2023-11-03 ENCOUNTER — Encounter (HOSPITAL_COMMUNITY): Payer: Self-pay

## 2023-11-03 ENCOUNTER — Other Ambulatory Visit: Payer: Self-pay

## 2023-11-03 ENCOUNTER — Encounter (HOSPITAL_COMMUNITY)
Admission: RE | Admit: 2023-11-03 | Discharge: 2023-11-03 | Disposition: A | Discharge status: Home / Self Care | Source: Ambulatory Visit | Attending: Surgery | Admitting: Surgery

## 2023-11-03 ENCOUNTER — Ambulatory Visit: Payer: Self-pay | Admitting: Surgery

## 2023-11-03 NOTE — Progress Notes (Signed)
 Call made to Travis Ryan(patient) to do Pre surgical testing interview. Pt. Answered all the questions. Pre op instructions given.His questions were answered. He verbalized understanding. His phone number given to admitting for them to call and do pre admit.

## 2023-11-03 NOTE — Progress Notes (Addendum)
 COVID Vaccine received:  []  No [x]  Yes Date of any COVID positive Test in last 90 days: no PCP - Dr. Emil Schaumann Cardiologist -   Chest x-ray -  EKG -  05/18/23 Epic Stress Test -  ECHO -  Cardiac Cath -   Bowel Prep - [x]  No  []   Yes ______  Pacemaker / ICD device [x]  No []  Yes   Spinal Cord Stimulator:[x]  No []  Yes       History of Sleep Apnea? [x]  No []  Yes   CPAP used?- [x]  No []  Yes    Does the patient monitor blood sugar?          [x]  No []  Yes  []  N/A  Patient has: [x]  NO Hx DM   []  Pre-DM                 []  DM1  []   DM2 Does patient have a [employer] or Dexacom? []  No []  Yes   Fasting Blood Sugar Ranges-  Checks Blood Sugar _____ times a day  GLP1 agonist / usual dose - no SGLT-2 inhibitors / usual dose - no SGLT-2 instructions:   Blood Thinner / Instructions:no Aspirin Instructions:no  Comments:   Activity level: Patient is able to climb a flight of stairs without difficulty; [x]  No CP  [x]  No SOB,  Patient can perform ADLs without assistance.   Anesthesia review:   Patient denies shortness of breath, fever, cough and chest pain at PAT appointment.  Patient verbalized understanding and agreement to the Pre-Surgical Instructions that were given to them at this PAT appointment. Patient was also educated of the need to review these PAT instructions again prior to his/her surgery.I reviewed the appropriate phone numbers to call if they have any and questions or concerns.

## 2023-11-03 NOTE — Progress Notes (Signed)
 Request sent to Dr. KYM Pizza to send pre op orders for PST visit 11/03/23.

## 2023-11-03 NOTE — Patient Instructions (Addendum)
 SURGICAL WAITING ROOM VISITATION  Patients having surgery or a procedure may have no more than 2 support people in the waiting area - these visitors may rotate.    Children under the age of 49 must have an adult with them who is not the patient.  Visitors with respiratory illnesses are discouraged from visiting and should remain at home.  If the patient needs to stay at the hospital during part of their recovery, the visitor guidelines for inpatient rooms apply. Pre-op nurse will coordinate an appropriate time for 1 support person to accompany patient in pre-op.  This support person may not rotate.    Please refer to the [facility] website for the visitor guidelines for Inpatients (after your surgery is over and you are in a regular room).       Your procedure is scheduled on: 11/04/23   Report to [facility] Main Entrance    Report to admitting at 11:45AM   Call this number if you have problems the morning of surgery [phone]   Do not eat food or drink liquids :After Midnight. May have sips of water with meds.      FOLLOW BOWEL PREP AND ANY ADDITIONAL PRE OP INSTRUCTIONS YOU RECEIVED FROM YOUR SURGEON'S OFFICE!!!     Oral Hygiene is also important to reduce your risk of infection.                                    Remember - BRUSH YOUR TEETH THE MORNING OF SURGERY WITH YOUR REGULAR TOOTHPASTE   Stop all vitamins and herbal supplements 7 days before surgery.   Take these medicines the morning of surgery with A SIP OF WATER: none             You may not have any metal on your body including hair pins, jewelry, and body piercing             Do not wear make-up, lotions, powders, perfumes/cologne, or deodorant              Men may shave face and neck.   Do not bring valuables to the hospital. [facility] IS NOT             RESPONSIBLE   FOR VALUABLES.   Contacts, glasses, dentures or bridgework may not be worn into surgery.  DO NOT BRING YOUR HOME MEDICATIONS  TO THE HOSPITAL. PHARMACY WILL DISPENSE MEDICATIONS LISTED ON YOUR MEDICATION LIST TO YOU DURING YOUR ADMISSION IN THE HOSPITAL!    Patients discharged on the day of surgery will not be allowed to drive home.  Someone NEEDS to stay with you for the first 24 hours after anesthesia.   Special Instructions: Bring a copy of your healthcare power of attorney and living will documents the day of surgery if you haven't scanned them before.              Please read over the following fact sheets you were given: IF YOU HAVE QUESTIONS ABOUT YOUR PRE-OP INSTRUCTIONS PLEASE CALL [phone] [place]   If you received a COVID test during your pre-op visit  it is requested that you wear a mask when out in public, stay away from anyone that may not be feeling well and notify your surgeon if you develop symptoms. If you test positive for Covid or have been in contact with anyone that has tested positive in the last  10 days please notify you surgeon.    [place] - Preparing for Surgery Before surgery, you can play an important role.  Because skin is not sterile, your skin needs to be as free of germs as possible.  You can reduce the number of germs on your skin by washing with CHG (chlorahexidine gluconate) soap before surgery.  CHG is an antiseptic cleaner which kills germs and bonds with the skin to continue killing germs even after washing. Please DO NOT use if you have an allergy to CHG or antibacterial soaps.  If your skin becomes reddened/irritated stop using the CHG and inform your nurse when you arrive at Short Stay. Do not shave (including legs and underarms) for at least 48 hours prior to the first CHG shower.  You may shave your face/neck.  Please follow these instructions carefully:  1.  Shower with CHG Soap the night before surgery and the  morning of surgery.  2.  If you choose to wash your hair, wash your hair first as usual with your normal  shampoo.  3.  After you shampoo, rinse your hair and body  thoroughly to remove the shampoo.                             4.  Use CHG as you would any other liquid soap.  You can apply chg directly to the skin and wash.  Gently with a scrungie or clean washcloth.  5.  Apply the CHG Soap to your body ONLY FROM THE NECK DOWN.   Do   not use on face/ open                           Wound or open sores. Avoid contact with eyes, ears mouth and   genitals (private parts).                       Wash face,  Genitals (private parts) with your normal soap.             6.  Wash thoroughly, paying special attention to the area where your    surgery  will be performed.  7.  Thoroughly rinse your body with warm water from the neck down.  8.  DO NOT shower/wash with your normal soap after using and rinsing off the CHG Soap.                9.  Pat yourself dry with a clean towel.            10.  Wear clean pajamas.            11.  Place clean sheets on your bed the night of your first shower and do not  sleep with pets. Day of Surgery : Do not apply any lotions/deodorants the morning of surgery.  Please wear clean clothes to the hospital/surgery center.  FAILURE TO FOLLOW THESE INSTRUCTIONS MAY RESULT IN THE CANCELLATION OF YOUR SURGERY  PATIENT SIGNATURE_________________________________  NURSE SIGNATURE__________________________________  ________________________________________________________________________

## 2023-11-04 ENCOUNTER — Encounter (HOSPITAL_COMMUNITY)
Admission: RE | Disposition: A | Discharge status: Home / Self Care | Payer: Self-pay | Source: Home / Self Care | Attending: Surgery

## 2023-11-04 ENCOUNTER — Ambulatory Visit (HOSPITAL_COMMUNITY): Payer: Self-pay | Admitting: Medical

## 2023-11-04 ENCOUNTER — Other Ambulatory Visit: Payer: Self-pay

## 2023-11-04 ENCOUNTER — Ambulatory Visit (HOSPITAL_COMMUNITY): Admitting: Certified Registered"

## 2023-11-04 ENCOUNTER — Ambulatory Visit (HOSPITAL_COMMUNITY)
Admission: RE | Admit: 2023-11-04 | Discharge: 2023-11-04 | Disposition: A | Discharge status: Home / Self Care | Attending: Surgery | Admitting: Surgery

## 2023-11-04 ENCOUNTER — Encounter (HOSPITAL_COMMUNITY): Payer: Self-pay | Admitting: Surgery

## 2023-11-04 DIAGNOSIS — K611 Rectal abscess: Secondary | ICD-10-CM | POA: Diagnosis not present

## 2023-11-04 DIAGNOSIS — E119 Type 2 diabetes mellitus without complications: Secondary | ICD-10-CM | POA: Diagnosis not present

## 2023-11-04 DIAGNOSIS — Z87891 Personal history of nicotine dependence: Secondary | ICD-10-CM | POA: Insufficient documentation

## 2023-11-04 DIAGNOSIS — I10 Essential (primary) hypertension: Secondary | ICD-10-CM | POA: Insufficient documentation

## 2023-11-04 DIAGNOSIS — K60329 Anal fistula, complex, unspecified: Secondary | ICD-10-CM | POA: Diagnosis not present

## 2023-11-04 DIAGNOSIS — J45909 Unspecified asthma, uncomplicated: Secondary | ICD-10-CM | POA: Insufficient documentation

## 2023-11-04 DIAGNOSIS — K603 Anal fistula, unspecified: Secondary | ICD-10-CM

## 2023-11-04 HISTORY — PX: PLACEMENT OF SETON: SHX6029

## 2023-11-04 HISTORY — PX: INCISION AND DRAINAGE PERIRECTAL ABSCESS: SHX1804

## 2023-11-04 HISTORY — PX: RECTAL EXAM UNDER ANESTHESIA: SHX6399

## 2023-11-04 LAB — GLUCOSE, CAPILLARY: Glucose-Capillary: 130 mg/dL — ABNORMAL HIGH (ref 70–99)

## 2023-11-04 SURGERY — PLACEMENT, SETON
Anesthesia: General

## 2023-11-04 MED ORDER — OXYCODONE HCL 5 MG PO TABS
ORAL_TABLET | ORAL | Status: AC
  Filled 2023-11-04: qty 1

## 2023-11-04 MED ORDER — ROCURONIUM BROMIDE 100 MG/10ML IV SOLN
INTRAVENOUS | Status: DC | PRN
  Administered 2023-11-04 (×2): 50 mg via INTRAVENOUS

## 2023-11-04 MED ORDER — AMISULPRIDE (ANTIEMETIC) 5 MG/2ML IV SOLN
INTRAVENOUS | Status: AC
  Filled 2023-11-04: qty 4

## 2023-11-04 MED ORDER — OXYCODONE HCL 5 MG PO TABS
5.0000 mg | ORAL_TABLET | Freq: Once | ORAL | Status: AC | PRN
  Administered 2023-11-04 (×2): 5 mg via ORAL

## 2023-11-04 MED ORDER — PROPOFOL 10 MG/ML IV BOLUS
INTRAVENOUS | Status: DC | PRN
  Administered 2023-11-04: 50 mg via INTRAVENOUS
  Administered 2023-11-04 (×2): 100 mg via INTRAVENOUS
  Administered 2023-11-04: 50 mg via INTRAVENOUS

## 2023-11-04 MED ORDER — DEXAMETHASONE SODIUM PHOSPHATE 10 MG/ML IJ SOLN
INTRAMUSCULAR | Status: AC
  Filled 2023-11-04: qty 1

## 2023-11-04 MED ORDER — ONDANSETRON HCL 4 MG/2ML IJ SOLN
INTRAMUSCULAR | Status: DC | PRN
  Administered 2023-11-04 (×2): 4 mg via INTRAVENOUS

## 2023-11-04 MED ORDER — LACTATED RINGERS IV SOLN: INTRAVENOUS | Status: DC

## 2023-11-04 MED ORDER — DIBUCAINE (PERIANAL) 1 % EX OINT: TOPICAL_OINTMENT | CUTANEOUS | Status: DC | PRN

## 2023-11-04 MED ORDER — FENTANYL CITRATE PF 50 MCG/ML IJ SOSY: 25.0000 ug | PREFILLED_SYRINGE | INTRAMUSCULAR | Status: DC | PRN

## 2023-11-04 MED ORDER — EPHEDRINE 5 MG/ML INJ
INTRAVENOUS | Status: AC
  Filled 2023-11-04: qty 5

## 2023-11-04 MED ORDER — DEXMEDETOMIDINE HCL IN NACL 80 MCG/20ML IV SOLN
INTRAVENOUS | Status: DC | PRN
  Administered 2023-11-04 (×4): 4 ug via INTRAVENOUS

## 2023-11-04 MED ORDER — ORAL CARE MOUTH RINSE: 15.0000 mL | Freq: Once | OROMUCOSAL | Status: AC

## 2023-11-04 MED ORDER — SUGAMMADEX SODIUM 200 MG/2ML IV SOLN
INTRAVENOUS | Status: AC
  Filled 2023-11-04: qty 2

## 2023-11-04 MED ORDER — DEXAMETHASONE SODIUM PHOSPHATE 10 MG/ML IJ SOLN
INTRAMUSCULAR | Status: DC | PRN
  Administered 2023-11-04 (×2): 5 mg via INTRAVENOUS

## 2023-11-04 MED ORDER — FENTANYL CITRATE (PF) 100 MCG/2ML IJ SOLN
INTRAMUSCULAR | Status: DC | PRN
  Administered 2023-11-04 (×8): 50 ug via INTRAVENOUS

## 2023-11-04 MED ORDER — OXYCODONE HCL 5 MG PO TABS
5.0000 mg | ORAL_TABLET | Freq: Four times a day (QID) | ORAL | 0 refills | Status: AC | PRN
Start: 2023-11-04 — End: 2023-11-09

## 2023-11-04 MED ORDER — MIDAZOLAM HCL 5 MG/5ML IJ SOLN
INTRAMUSCULAR | Status: DC | PRN
  Administered 2023-11-04 (×2): 2 mg via INTRAVENOUS

## 2023-11-04 MED ORDER — FLEET ENEMA RE ENEM: 1.0000 | ENEMA | Freq: Once | RECTAL | Status: DC

## 2023-11-04 MED ORDER — BUPIVACAINE-EPINEPHRINE (PF) 0.25% -1:200000 IJ SOLN
INTRAMUSCULAR | Status: AC
  Filled 2023-11-04: qty 30

## 2023-11-04 MED ORDER — OXYCODONE HCL 5 MG/5ML PO SOLN: 5.0000 mg | Freq: Once | ORAL | Status: AC | PRN

## 2023-11-04 MED ORDER — 0.9 % SODIUM CHLORIDE (POUR BTL) OPTIME
TOPICAL | Status: DC | PRN
  Administered 2023-11-04 (×2): 1000 mL

## 2023-11-04 MED ORDER — ONDANSETRON HCL 4 MG/2ML IJ SOLN
INTRAMUSCULAR | Status: AC
  Filled 2023-11-04: qty 2

## 2023-11-04 MED ORDER — CHLORHEXIDINE GLUCONATE 0.12 % MT SOLN
15.0000 mL | Freq: Once | OROMUCOSAL | Status: AC
  Administered 2023-11-04 (×2): 15 mL via OROMUCOSAL

## 2023-11-04 MED ORDER — ROCURONIUM BROMIDE 10 MG/ML (PF) SYRINGE
PREFILLED_SYRINGE | INTRAVENOUS | Status: AC
  Filled 2023-11-04: qty 10

## 2023-11-04 MED ORDER — MIDAZOLAM HCL 2 MG/2ML IJ SOLN
INTRAMUSCULAR | Status: AC
  Filled 2023-11-04: qty 2

## 2023-11-04 MED ORDER — PHENYLEPHRINE 80 MCG/ML (10ML) SYRINGE FOR IV PUSH (FOR BLOOD PRESSURE SUPPORT)
PREFILLED_SYRINGE | INTRAVENOUS | Status: AC
  Filled 2023-11-04: qty 10

## 2023-11-04 MED ORDER — LIDOCAINE HCL (CARDIAC) PF 100 MG/5ML IV SOSY
PREFILLED_SYRINGE | INTRAVENOUS | Status: DC | PRN
  Administered 2023-11-04 (×4): 50 mg via INTRAVENOUS

## 2023-11-04 MED ORDER — PROPOFOL 10 MG/ML IV BOLUS
INTRAVENOUS | Status: AC
  Filled 2023-11-04: qty 20

## 2023-11-04 MED ORDER — BUPIVACAINE-EPINEPHRINE (PF) 0.25% -1:200000 IJ SOLN
INTRAMUSCULAR | Status: DC | PRN
  Administered 2023-11-04 (×2): 30 mL via PERINEURAL

## 2023-11-04 MED ORDER — FENTANYL CITRATE (PF) 100 MCG/2ML IJ SOLN
INTRAMUSCULAR | Status: AC
Start: 2023-11-04 — End: 2023-11-04
  Filled 2023-11-04: qty 2

## 2023-11-04 MED ORDER — ACETAMINOPHEN 500 MG PO TABS
1000.0000 mg | ORAL_TABLET | ORAL | Status: AC
  Administered 2023-11-04 (×2): 1000 mg via ORAL
  Filled 2023-11-04: qty 2

## 2023-11-04 MED ORDER — DIBUCAINE (PERIANAL) 1 % EX OINT
TOPICAL_OINTMENT | CUTANEOUS | Status: AC
  Filled 2023-11-04: qty 28

## 2023-11-04 MED ORDER — SUGAMMADEX SODIUM 200 MG/2ML IV SOLN
INTRAVENOUS | Status: DC | PRN
  Administered 2023-11-04 (×2): 200 mg via INTRAVENOUS

## 2023-11-04 MED ORDER — AMISULPRIDE (ANTIEMETIC) 5 MG/2ML IV SOLN
10.0000 mg | Freq: Once | INTRAVENOUS | Status: AC | PRN
  Administered 2023-11-04 (×2): 10 mg via INTRAVENOUS

## 2023-11-04 MED ORDER — LIDOCAINE HCL (PF) 2 % IJ SOLN
INTRAMUSCULAR | Status: AC
  Filled 2023-11-04: qty 5

## 2023-11-04 SURGICAL SUPPLY — 37 items
BAG COUNTER SPONGE SURGICOUNT (BAG) IMPLANT
BENZOIN TINCTURE PRP APPL 2/3 (GAUZE/BANDAGES/DRESSINGS) IMPLANT
BLADE SURG 15 STRL LF DISP TIS (BLADE) IMPLANT
BRIEF MESH DISP LRG (UNDERPADS AND DIAPERS) ×2 IMPLANT
COVER SURGICAL LIGHT HANDLE (MISCELLANEOUS) ×2 IMPLANT
DISSECTOR SURG LIGASURE 21 (MISCELLANEOUS) IMPLANT
DRAPE LAPAROTOMY T 102X78X121 (DRAPES) ×2 IMPLANT
ELECT NDL TIP 2.8 STRL (NEEDLE) ×2 IMPLANT
ELECT NEEDLE TIP 2.8 STRL (NEEDLE) ×1 IMPLANT
ELECT PENCIL ROCKER SW 15FT (MISCELLANEOUS) IMPLANT
ELECT REM PT RETURN 15FT ADLT (MISCELLANEOUS) ×2 IMPLANT
GAUZE 4X4 16PLY ~~LOC~~+RFID DBL (SPONGE) ×2 IMPLANT
GAUZE PAD ABD 8X10 STRL (GAUZE/BANDAGES/DRESSINGS) IMPLANT
GAUZE SPONGE 4X4 12PLY STRL (GAUZE/BANDAGES/DRESSINGS) IMPLANT
GAUZE SPONGE 4X4 12PLY STRL LF (GAUZE/BANDAGES/DRESSINGS) IMPLANT
GLOVE BIO SURGEON STRL SZ7.5 (GLOVE) ×2 IMPLANT
GLOVE INDICATOR 8.0 STRL GRN (GLOVE) ×2 IMPLANT
GOWN STRL REUS W/ TWL XL LVL3 (GOWN DISPOSABLE) ×2 IMPLANT
KIT BASIN OR (CUSTOM PROCEDURE TRAY) ×2 IMPLANT
KIT TURNOVER KIT A (KITS) ×2 IMPLANT
LOOP VESSEL MAXI BLUE (MISCELLANEOUS) IMPLANT
NDL HYPO 22X1.5 SAFETY MO (MISCELLANEOUS) ×2 IMPLANT
NEEDLE HYPO 22X1.5 SAFETY MO (MISCELLANEOUS) ×1 IMPLANT
PACK BASIC VI WITH GOWN DISP (CUSTOM PROCEDURE TRAY) ×2 IMPLANT
PAK SCROTO (SET/KITS/TRAYS/PACK) IMPLANT
RETRACTOR RING URO 16.6X16.6 (MISCELLANEOUS) IMPLANT
RETRACTOR STAY HOOK 5MM (MISCELLANEOUS) IMPLANT
SHEARS HARMONIC 9CM CVD (BLADE) IMPLANT
SPIKE FLUID TRANSFER (MISCELLANEOUS) ×2 IMPLANT
SURGILUBE 2OZ TUBE FLIPTOP (MISCELLANEOUS) ×2 IMPLANT
SUT CHROMIC 2 0 SH (SUTURE) IMPLANT
SUT CHROMIC 3 0 SH 27 (SUTURE) IMPLANT
SUT SILK 0 30XBRD TIE 6 (SUTURE) IMPLANT
SUT VIC AB 2-0 SH 27X BRD (SUTURE) IMPLANT
SUT VIC AB 2-0 UR6 27 (SUTURE) ×4 IMPLANT
SYR 20ML LL LF (SYRINGE) ×2 IMPLANT
TOWEL OR 17X26 10 PK STRL BLUE (TOWEL DISPOSABLE) ×2 IMPLANT

## 2023-11-04 NOTE — Transfer of Care (Signed)
 Immediate Anesthesia Transfer of Care Note  Patient: Travis Ryan  Procedure(s) Performed: PLACEMENT, SETON EXAM UNDER ANESTHESIA, RECTUM INCISION AND DRAINAGE, ABSCESS, PERIRECTAL  Patient Location: PACU  Anesthesia Type:General  Level of Consciousness: drowsy and patient cooperative  Airway & Oxygen Therapy: Patient Spontanous Breathing and Patient connected to face mask oxygen  Post-op Assessment: Report given to RN and Post -op Vital signs reviewed and stable  Post vital signs: Reviewed and stable  Last Vitals:  Vitals Value Taken Time  BP 124/79 11/04/23 14:15  Temp 36.5 C 11/04/23 14:15  Pulse 71 11/04/23 14:18  Resp 15 11/04/23 14:18  SpO2 99 % 11/04/23 14:18  Vitals shown include unfiled device data.  Last Pain:  Vitals:   11/04/23 1232  TempSrc:   PainSc: 0-No pain         Complications: No notable events documented.

## 2023-11-04 NOTE — Op Note (Signed)
 11/04/2023  2:01 PM  PATIENT:  Travis Ryan  48 y.o. male  Patient Care Team: Purcell Emil Schanz, MD as PCP - General (Internal Medicine) Patient, No Pcp Per (General Practice)  PRE-OPERATIVE DIAGNOSIS:  Transsphincteric anal fistula  POST-OPERATIVE DIAGNOSIS:  Same  PROCEDURE:   Surgical treatment of transsphincteric anal fistula (recurrent) with placement of draining seton Incision and drainage of perirectal abscess Anorectal exam under anesthesia  SURGEON:  Surgeon(s): Orrin Yurkovich, Lonni HERO, MD  ASSISTANT: Starleen Barrier, MD   ANESTHESIA:   local and general  SPECIMEN:   None  DISPOSITION OF SPECIMEN:  PATHOLOGY  COUNTS:  Sponge, needle, and instrument counts were reported correct x2 at conclusion.  EBL: 5 mL  Drains: Draining seton placed (blue vessel loop)  PLAN OF CARE: Discharge to home after PACU  PATIENT DISPOSITION:  PACU - hemodynamically stable.  OR FINDINGS: Significant fibrosis involving left ischiorectal fossa.  Transsphincteric anal fistula in the left anterior position.  Internal opening left lateral.  External opening left anterior.  Small perirectal abscess with approximately 2 cc of purulent fluid drained.  Anoderm with granulation on the left lateral portion but the remainder of the anal canal and distal rectum are normal in appearance.  Blue vessel loop draining seton was placed.  DESCRIPTION: The patient was seen in the pre-op holding area. The risks, benefits, complications, treatment options, and expected outcomes were previously discussed with the patient. The patient agreed with the proposed plan and has signed the informed consent form. The patient was brought to the operating room by the surgical team, identified as [facility], and the procedure verified. SCD's were applied. General anesthesia was induced without difficulty. The patient was then rolled onto the OR table in the prone jackknife position.  Pressure points were  evaluated and padded.  Benzoin was applied to the buttocks and they were gently taped apart.  He was then prepped and draped in usual sterile fashion. A time out was completed and the above information confirmed and need for preoperative antibiotics.  A perianal block was then created using a dilute mixture of 0.25% Marcaine  with epinephrine .  After ascertaining an appropriate level of anesthesia had been achieved, a well lubricated digital rectal exam was performed. This demonstrated no palpable masses.  There is significant fibrosis along the left ischiorectal fossa.  Fullness is palpated in this area as well.  We then incised the external opening in the left anterior position and approximately 2 cc of Destini Cambre purulent fluid was drained.  A Hill-Ferguson anoscope was into the anal canal and circumferential inspection demonstrated granulation tissue in the left lateral position.  The remaining portions of the anal canal are normal in appearance.  Distal rectum also appears normal.  Externally, at the site of his presumed external opening, there is significant fibrosis and thickening of all the skin in this area.  Prior fistula left posterior remains closing with relatively normal skin.  Constellation of findings are concerning for potential perianal Crohn's disease.  A semirigid fistula probe was brought onto the field.  We were able to insinuate this in the external opening in the left anterior position and easily cannulate the tract extending into the anal canal at the level of the dentate where an internal opening is readily identified.  This is in the left lateral position.  The fistula probe was exchanged for a blue vessel loop seton which is secured to itself using 3-0 silk suture.  The anal canal is then irrigated.  Hemostasis  is verified.  There are no other significant perianal findings.  All sponge, needle, and instrument counts are reported correct.  The buttocks are untaped.  A dressing  consisting of 4 x 4's, ABD, and tape is then placed.  He is then rolled back onto a stretcher, awakened from anesthesia, extubated, and transported to the recovery room in satisfactory condition.  DISPOSITION: PACU in satisfactory condition.

## 2023-11-04 NOTE — H&P (Signed)
 CC: Here today for surgery  HPI: Travis Ryan is an 48 y.o. male with history of seasonal allergies and prior perianal abscesses, whom is seen in the office today for evaluation. He was seen last week in our urgent office with Puja.  First developed some discomfort/perianal pain back in October, 2023. He was found to have a left perianal abscess and suspected anal fissure. He was given Augmentin , nifedipine  cream, and Anusol  suppositories. He was subsequently referred to see GI-Dr. Stacia and colorectal surgery but did not make an appointment at that time due to lack of insurance. He had persistent to some degree perianal pain in the interim. He notes some minor tight pressure in the perianal area. Some yellowish drainage occasionally as well. Will wear pads to control the amount of drainage she has had in the past.  Denies ever having had a colonoscopy. 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 He underwent incision/drainage in the office. He was subsequently scheduled to see us  for follow-up. He denies any history of any prior anal abscesses, swelling, or pain prior to October, 2023.  Overall since having an I&D last week, reports all of his symptoms have improved. Currently no significant perianal pain. Scant drainage. No fever or chills.  He is fluent in [place] and was offered but declined medical interpreter.  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 Stacia 03/11/23 -diverticulosis in sigmoid and descending; examined portion of terminal ileum was normal; distal rectum and anal verge are normal on retroflexion; no evidence of Crohn's disease  OR 07/13/23 - Endorectal adv flap closure  OR FINDINGS: Indwelling blue vessel loop setons x 2 with a shared internal opening. 2 discrete external openings which have keloid like findings at the cutaneous surface. Both of these external openings are excised and submitted for pathology. Internal opening and remainder of anal canal are completely normal in appearance. Therefore, we opted to proceed with creation of endorectal advancement flap as he was previously had a unsuccessful LIFT procedure. We felt endorectal advancement flap was the most continence preserving type procedure as opposed to fistulotomy at present.   Returns for f/u. He has been doing quite well. He reports things feel much different than after his last attempt at repairing this time he notes no discomfort and that he has minimal if any drainage. Just had their first child Aug 01, 2023.  Perianal pain and concern for abscess. He was subsequently seen in the ER 10/21/2023: - Suspected fistulous tract in the left perianal region. No discrete abscess or drainable collection  Returns for follow-up. He has some persistent discomfort at his suspected fistula site and drainage. No pain like he had in the past but had an abscess. Controls the drainage with gauze. Denies any history of incontinence to gas, liquid, or solid stool.  He denies any changes in health or health history since we met in the office. No new medications/allergies. He states he is ready for surgery today.  PMH: Seasonal allergies  PSH: I&D of perianal abscess x1 as above  FHx: Denies any  known family history of colorectal, breast, endometrial or ovarian cancer  Social Hx: Drinks ~1 beer per day. Denies use of tobacco/illicit drug. 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  Past Medical History:  Diagnosis Date   Anal fistula    Asthma    Seasonal   Diabetes mellitus without complication (HCC)    no meds hgbA1c 7.0 05-18-23   Hypertension    no meds think it is pain related   Perirectal abscess    Seasonal allergies     Past Surgical History:  Procedure Laterality Date   FISTULOTOMY N/A 05/15/2022   Procedure: CONTROL OF ANAL FISTULA/INTERROGATION WITH Partial FISTULOTOMY and SETON, POSSIBLE INCISION/DRAINAGE OF PERIRECTAL ABSCESS;  Surgeon: Teresa Lonni HERO, MD;  Location: [facility];  Service: General;  Laterality: N/A;   INGUINAL HERNIA REPAIR Left    2018   LIGATION OF INTERNAL FISTULA TRACT N/A 08/21/2022   Procedure: LIGATION OF INTERSPHINCTERIC FISTULA TRACT;  Surgeon: Teresa Lonni HERO, MD;  Location: [facility];  Service: General;  Laterality: N/A;   MUCOSAL ADVANCEMENT FLAP N/A 07/13/2023   Procedure: SURGICAL TREATMENT ANAL FISTULA;  Surgeon: Teresa Lonni HERO, MD;  Location: [facility];  Service: General;  Laterality: N/A;   PLACEMENT OF SETON N/A 01/07/2023   Procedure: FISTULA TRANSSPHINCTERIC PLACEMENT OF SETON x2;  Surgeon: Teresa Lonni HERO, MD;  Location: [facility];  Service: General;  Laterality: N/A;   RECTAL EXAM UNDER ANESTHESIA N/A 08/21/2022   Procedure: ANORECTAL EXAM UNDER ANESTHESIA;  Surgeon: Teresa Lonni HERO, MD;  Location: [facility];  Service: General;  Laterality: N/A;   RECTAL EXAM UNDER ANESTHESIA N/A 01/07/2023   Procedure: RECTAL EXAM UNDER ANESTHESIA;  Surgeon: Teresa Lonni HERO, MD;  Location: [facility];  Service: General;  Laterality: N/A;  62   RECTAL EXAM UNDER ANESTHESIA N/A 07/13/2023   Procedure: EXAM UNDER ANESTHESIA, RECTUM;  Surgeon: Teresa Lonni HERO, MD;  Location: [facility];  Service: General;  Laterality: N/A;    Family History  Problem Relation Age of Onset   Cancer Maternal Grandmother    Early  death Maternal Grandfather    Cancer Paternal Grandmother    Early death Paternal Grandfather    Stomach cancer Neg Hx    Esophageal cancer Neg Hx    Colon cancer Neg Hx    Colon polyps Neg Hx    Rectal cancer Neg Hx     Social:  reports that he quit smoking about 15 years ago. His smoking use included cigarettes. He started smoking about 20 years ago. He has never been exposed to tobacco smoke. He has never used smokeless tobacco. He reports current alcohol use of about 14.0 standard drinks of alcohol per week. He reports that he does not use drugs.  Allergies: No Known Allergies  Medications: I have reviewed the patient's current medications.  No results found for this or any previous visit (from the past 48 hours).  No results found.   PE There were no vitals taken for this visit. Constitutional: NAD; conversant Eyes: Moist conjunctiva; no lid lag; anicteric Lungs: Normal respiratory effort CV: RRR Psychiatric: Appropriate affect  No results found for this or any previous visit (from the past 48 hours).  No results found.  A/P: Edmar Blankenburg is an 48 y.o. 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 Stacia 03/11/23 -diverticulosis in  sigmoid and descending; examined portion of terminal ileum was normal; distal rectum and anal verge are normal on retroflexion; no evidence of Crohn's disease OR 4/21 - ERAF  CT Pelvis suggests possible fistula 10/21/23  - We discussed the anatomy and physiology of the anorectal region and pathophysiology of anal abscess and fistula with associated illustrations using the American Society of Colon and Rectal Surgery trifold handout on anal abscess and fistula - We have reviewed options going forward including further observation vs surgery -exam under anesthesia, anorectal; likely placement of  draining seton(s); possible incision/drainage of perianal abscess if present  - The planned procedure, material risks (including, but not limited to, pain, bleeding, infection, scarring, need for blood transfusion, damage to anal sphincter, incontinence of gas and/or stool, need for additional procedures, anal stenosis, rare cases of pelvic sepsis which in severe cases may require things like a colostomy, recurrence, blood clot, pulmonary embolus, pneumonia, heart attack, stroke, death) benefits and alternatives to surgery were discussed at length. He has had setons before and understands this. He understands that this would not be curative for his fistulas.  - The patient's questions were answered to his satisfaction, he voiced understanding and elected to proceed with surgery. Additionally, we discussed typical postoperative expectations and the recovery process.  -After we get this controlled with setons, I think would be reasonable to touch base again with gastroenterology about the potential for perianal Crohn's disease.  Lonni Pizza, MD [facility], [facility]

## 2023-11-04 NOTE — Anesthesia Procedure Notes (Signed)
 Procedure Name: Intubation Date/Time: 11/04/2023 1:22 PM  Performed by: Rhodia Debby MATSU, CRNAPre-anesthesia Checklist: Patient identified, Emergency Drugs available, Suction available, Patient being monitored and Timeout performed Patient Re-evaluated:Patient Re-evaluated prior to induction Oxygen Delivery Method: Circle system utilized Preoxygenation: Pre-oxygenation with 100% oxygen Induction Type: IV induction Ventilation: Mask ventilation without difficulty Laryngoscope Size: Miller and 2 Grade View: Grade I Tube type: Oral Tube size: 7.5 mm Number of attempts: 1 Airway Equipment and Method: Stylet Placement Confirmation: ETT inserted through vocal cords under direct vision, positive ETCO2 and breath sounds checked- equal and bilateral Secured at: 23 cm Tube secured with: Tape Dental Injury: Teeth and Oropharynx as per pre-operative assessment  Comments: Smooth brief atraukjmatic dentition unchnaged

## 2023-11-04 NOTE — Discharge Instructions (Addendum)

## 2023-11-04 NOTE — Anesthesia Preprocedure Evaluation (Addendum)
 Anesthesia Evaluation  Patient identified by MRN, date of birth, ID band Patient awake    Reviewed: Allergy & Precautions, NPO status , Patient's Chart, lab work & pertinent test results  Airway Mallampati: I  TM Distance: >3 FB Neck ROM: Full    Dental  (+) Dental Advisory Given, Chipped,    Pulmonary asthma , former smoker   Pulmonary exam normal breath sounds clear to auscultation       Cardiovascular hypertension, Normal cardiovascular exam Rhythm:Regular Rate:Normal     Neuro/Psych negative neurological ROS  negative psych ROS   GI/Hepatic negative GI ROS,,,(+)     substance abuse  alcohol use  Endo/Other  diabetes, Well Controlled    Renal/GU negative Renal ROS  negative genitourinary   Musculoskeletal negative musculoskeletal ROS (+)    Abdominal   Peds  Hematology negative hematology ROS (+)   Anesthesia Other Findings   Reproductive/Obstetrics                              Anesthesia Physical Anesthesia Plan  ASA: 2  Anesthesia Plan: General   Post-op Pain Management: Tylenol  PO (pre-op)*   Induction: Intravenous  PONV Risk Score and Plan: 2 and Midazolam , Dexamethasone  and Ondansetron   Airway Management Planned: Oral ETT  Additional Equipment:   Intra-op Plan:   Post-operative Plan: Extubation in OR  Informed Consent: I have reviewed the patients History and Physical, chart, labs and discussed the procedure including the risks, benefits and alternatives for the proposed anesthesia with the patient or authorized representative who has indicated his/her understanding and acceptance.     Dental advisory given  Plan Discussed with: CRNA  Anesthesia Plan Comments:          Anesthesia Quick Evaluation

## 2023-11-05 ENCOUNTER — Encounter (HOSPITAL_COMMUNITY): Payer: Self-pay | Admitting: Surgery

## 2023-11-06 NOTE — Anesthesia Postprocedure Evaluation (Signed)
 Anesthesia Post Note  Patient: Travis Ryan  Procedure(s) Performed: PLACEMENT, SETON EXAM UNDER ANESTHESIA, RECTUM INCISION AND DRAINAGE, ABSCESS, PERIRECTAL     Patient location during evaluation: PACU Anesthesia Type: General Level of consciousness: awake and alert Pain management: pain level controlled Vital Signs Assessment: post-procedure vital signs reviewed and stable Respiratory status: spontaneous breathing, nonlabored ventilation, respiratory function stable and patient connected to nasal cannula oxygen Cardiovascular status: blood pressure returned to baseline and stable Postop Assessment: no apparent nausea or vomiting Anesthetic complications: no   No notable events documented.  Last Vitals:  Vitals:   11/04/23 1500 11/04/23 1506  BP: (!) 129/90 (!) 142/80  Pulse: 73 70  Resp: 18 18  Temp:  36.6 C  SpO2: 95% 94%    Last Pain:  Vitals:   11/04/23 1606  TempSrc:   PainSc: 0-No pain                 Woodard Perrell L Doria Fern
